# Patient Record
Sex: Female | Born: 1945 | Race: White | Hispanic: No | Marital: Married | Smoking: Never smoker
Health system: Southern US, Community
[De-identification: ages and names within clinical notes are randomized; demographics above are authoritative.]

## PROBLEM LIST (undated history)

## (undated) DIAGNOSIS — T8859XA Other complications of anesthesia, initial encounter: Secondary | ICD-10-CM

## (undated) DIAGNOSIS — Z9889 Other specified postprocedural states: Secondary | ICD-10-CM

## (undated) DIAGNOSIS — C439 Malignant melanoma of skin, unspecified: Secondary | ICD-10-CM

## (undated) DIAGNOSIS — R112 Nausea with vomiting, unspecified: Secondary | ICD-10-CM

## (undated) DIAGNOSIS — M1990 Unspecified osteoarthritis, unspecified site: Secondary | ICD-10-CM

## (undated) DIAGNOSIS — D229 Melanocytic nevi, unspecified: Secondary | ICD-10-CM

## (undated) DIAGNOSIS — T4145XA Adverse effect of unspecified anesthetic, initial encounter: Secondary | ICD-10-CM

## (undated) HISTORY — DX: Unspecified osteoarthritis, unspecified site: M19.90

---
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
## 1995-04-17 HISTORY — PX: SHOULDER ADHESION RELEASE: SHX773

## 1998-09-06 ENCOUNTER — Other Ambulatory Visit: Admission: RE | Admit: 1998-09-06 | Discharge: 1998-09-06 | Payer: Self-pay | Admitting: Gynecology

## 1999-02-21 ENCOUNTER — Ambulatory Visit (HOSPITAL_COMMUNITY): Admission: RE | Admit: 1999-02-21 | Discharge: 1999-02-21 | Payer: Self-pay | Admitting: Gastroenterology

## 1999-04-23 ENCOUNTER — Emergency Department (HOSPITAL_COMMUNITY): Admission: EM | Admit: 1999-04-23 | Discharge: 1999-04-23 | Payer: Self-pay | Admitting: Emergency Medicine

## 1999-05-29 ENCOUNTER — Encounter (INDEPENDENT_AMBULATORY_CARE_PROVIDER_SITE_OTHER): Payer: Self-pay | Admitting: Specialist

## 1999-05-29 ENCOUNTER — Other Ambulatory Visit: Admission: RE | Admit: 1999-05-29 | Discharge: 1999-05-29 | Payer: Self-pay | Admitting: Gynecology

## 1999-09-06 ENCOUNTER — Other Ambulatory Visit: Admission: RE | Admit: 1999-09-06 | Discharge: 1999-09-06 | Payer: Self-pay | Admitting: Gynecology

## 2000-09-10 ENCOUNTER — Other Ambulatory Visit: Admission: RE | Admit: 2000-09-10 | Discharge: 2000-09-10 | Payer: Self-pay | Admitting: Gynecology

## 2001-09-04 ENCOUNTER — Other Ambulatory Visit: Admission: RE | Admit: 2001-09-04 | Discharge: 2001-09-04 | Payer: Self-pay | Admitting: Gynecology

## 2002-09-29 ENCOUNTER — Other Ambulatory Visit: Admission: RE | Admit: 2002-09-29 | Discharge: 2002-09-29 | Payer: Self-pay | Admitting: Gynecology

## 2003-04-17 HISTORY — PX: REPLACEMENT TOTAL HIP W/  RESURFACING IMPLANTS: SUR1222

## 2003-11-04 ENCOUNTER — Other Ambulatory Visit: Admission: RE | Admit: 2003-11-04 | Discharge: 2003-11-04 | Payer: Self-pay | Admitting: Gynecology

## 2004-03-20 ENCOUNTER — Inpatient Hospital Stay (HOSPITAL_COMMUNITY): Admission: RE | Admit: 2004-03-20 | Discharge: 2004-03-23 | Payer: Self-pay | Admitting: Orthopedic Surgery

## 2004-11-15 ENCOUNTER — Other Ambulatory Visit: Admission: RE | Admit: 2004-11-15 | Discharge: 2004-11-15 | Payer: Self-pay | Admitting: Gynecology

## 2005-02-27 DIAGNOSIS — D229 Melanocytic nevi, unspecified: Secondary | ICD-10-CM

## 2005-02-27 HISTORY — DX: Melanocytic nevi, unspecified: D22.9

## 2005-12-19 ENCOUNTER — Other Ambulatory Visit: Admission: RE | Admit: 2005-12-19 | Discharge: 2005-12-19 | Payer: Self-pay | Admitting: Gynecology

## 2009-02-18 ENCOUNTER — Ambulatory Visit: Payer: Self-pay | Admitting: Genetic Counselor

## 2009-03-23 ENCOUNTER — Ambulatory Visit: Payer: Self-pay | Admitting: Genetic Counselor

## 2010-09-01 NOTE — H&P (Signed)
NAMEJULIE-ANNE, [name] NO.:  [id number]   MEDICAL RECORD NO.:  [id number]          PATIENT TYPE:  INP   LOCATION:  NA                           FACILITY:  [facility]   PHYSICIAN:  Ollen Gross, M.D.    DATE OF BIRTH:  [birth year]   DATE OF ADMISSION:  03/20/2004  DATE OF DISCHARGE:                                HISTORY & PHYSICAL   CHIEF COMPLAINT:  Right hip pain.   HISTORY OF PRESENT ILLNESS:  This 65 year old female who has been seen and  followed by Dr. Ollen Gross for ongoing right hip pain.  She has had right  hip pain for some time now.  It has been quite limiting to what she can and  cannot do.  She is seen in the office and has had follow-up x-rays which  show end-stage arthritis with bone-on-bone.  It was felt that she would  benefit from undergoing total hip replacement.   The risks and benefits discussed.  The patient is subsequently admitted to  the hospital.   ALLERGIES:  PROMETRIUM, IODINE, and GLUTTON FISH allergy.   CURRENT MEDICATIONS:  Multivitamins.   PAST MEDICAL HISTORY:  1.  Postmenopausal.  2.  Arthritis.   PAST SURGICAL HISTORY:  1.  Right shoulder surgery in 1997.  2.  Repair of a rectal fissure in 1992.  3.  Right breast biopsy, fibroadenoma in 1991.   SOCIAL HISTORY:  Married.  Registered nurse.  Nonsmoker.  No alcohol.  Has  one son.  Husband, son, and friends will be assisting with the care after  surgery.   FAMILY HISTORY:  Brother deceased with a history of glioblastoma, cancer,  and a sister with a history of breast cancer.   REVIEW OF SYMPTOMS:  GENERAL:  No fevers, chills, night sweats.  NEUROLOGICAL:  No seizure, syncope, or paralysis.  RESPIRATORY:  No  shortness of breath, productive cough, or hemoptysis.  CARDIOVASCULAR:  No  chest pain, angina, or orthopnea.  GI:  No nausea, vomiting, diarrhea,  constipation.  GU:  No dysuria or hematuria.  MUSCULOSKELETAL:  Pertinent  that of the right hip.   PHYSICAL  EXAMINATION:  VITAL SIGNS:  Pulse 60, respirations 12, blood  pressure 112/70.  GENERAL:  A 65 year old, thin-framed, athletic appearing female. Well-  developed, well-nourished in no acute distress.  Alert, oriented, and  cooperative.  HEENT:  Normocephalic and atraumatic.  Pupils are equal, round, and  reactive.  Oropharynx clear.  EOMs are intact.  NECK:  Supple.  No bruits.  CHEST:  Clear anteriorly and posteriorly.  No rhonchi, rales, or wheezing.  HEART:  Regular rate and rhythm.  No murmur.  S1 and S2 noted.  ABDOMEN:  Soft and nontender.  Bowel sounds present.  RECTAL:  Not done, not pertinent to present illness.  BREASTS:  Not done, not pertinent to present illness.  GENITALIA:  Not done, not pertinent to present illness.  EXTREMITIES:  Right hip only shows flexion of 90 degrees.  Internal rotation  about 50 degrees with external rotation of 20 degrees abduction.  Normal  left hip range  of motion.  Minimal antalgic gait.   IMPRESSION:  1.  Osteoarthritis right hip.  2.  Postmenopausal.   PLAN:  The patient is admitted to  [facility].  Undergo right total  hip arthroplasty.  Surgery will be performed by Dr. Ollen Gross.     Alex   ALP/MEDQ  D:  03/19/2004  T:  03/20/2004  Job:  [id number]   cc:   Otilio Connors. Gerri Spore, M.D.  [address]  [place]  [place] [address]  Fax: [phone]   Ollen Gross, M.D.  [facility]  [address]  [address]  [place]  [place] [address]  Fax: [phone]

## 2010-09-01 NOTE — Discharge Summary (Signed)
NAMEDIXIE, COPPA NO.:  [id number]   MEDICAL RECORD NO.:  [id number]          PATIENT TYPE:  INP   LOCATION:  0469                         FACILITY:  [facility]   PHYSICIAN:  Ollen Gross, M.D.    DATE OF BIRTH:  1946/02/02   DATE OF ADMISSION:  03/20/2004  DATE OF DISCHARGE:  03/23/2004                                 DISCHARGE SUMMARY   ADMISSION DIAGNOSES:  1.  Osteoarthritis left knee.  2.  Hypertension.  3.  Reflux disease.  4.  Peripheral edema.  5.  Past history of anemia.   DISCHARGE DIAGNOSES:  1.  Osteoarthritis right hip status post right total knee arthroplasty metal      on metal construct.  2.  Hypokalemia post op improved.  3.  Osteoarthritis left knee.  4.  Hypertension.  5.  Reflux disease.  6.  Peripheral edema.  7.  Past history of anemia.   PROCEDURE:  March 20, 2004 right total hip arthroplasty metal on metal  construct. Surgeon, Ollen Gross, M.D., the assistant Avel Peace, P.A.-C.  General anesthesia, blood loss 400 mL, Hemovac drain x1.   CONSULTATIONS:  None.   BRIEF HISTORY:  Tom[place] is a 65 year old female with end-stage arthritis of  the right hip with intractable pain that has failed nonoperative management  and now presents for a total hip arthroplasty.   LABORATORY DATA:  Preop CBC showed a hemoglobin of 13.6, hematocrit 40.1,  differential normal, postoperative hemoglobin 11.5, last noted H&H 10.8 and  31.5.  PT and PTT preop 12.2 and 28 respectively with an INR of 0.9, serial  pro time last noted PT/INR 18.1 and 1.8.  Chem panel on admission slightly  elevated with a CO2 of 33, remaining chem panel within normal limits.  Serial BMET's were followed.  CO2 came down to 30.  Potassium dropped from  4.5 to 3.3.  Given supplements potassium back up to 3.9.  Remaining  electrolytes remained within normal limits.  Urinalysis preop negative,  blood group type A positive.   Preop EKG March 14, 2004, normal sinus  rhythm, normal EKG, no previous  tracing confirmed by Armanda Magic, M.D.  Chest x-ray two view on March 14, 2004 no active disease, right hip film, severe degenerative disease  right hip joint on March 14, 2004.  Pelvis film on March 20, 2004 right  total hip in good position, right hip film right total hip in good position  March 20, 2004.   HOSPITAL COURSE:  The patient admitted to [facility], taken to the  OR, underwent the above procedure without complications.  Did have some  nausea on the night of surgery and the next morning.  Some pain though most  of it was incisional. Started therapy on day one, Hemovac drain was pulled  on day one, by day two she was doing better.  Had been already up sitting in  the chair the day before Foley came out. She started getting up walking with  more therapy. Dressing change incision healing well.  Noted to have a low  potassium so  she was placed on K-Dur supplements and rechecked.  Potassium  came up the next day to 3.9.  She actually did fairly well with her therapy  on day two ambulating approximately 200 feet and then 150 feet that  afternoon. She did so well by day three she had progressed with her therapy  and was ready to go home.   PLAN:  1.  The patient discharged home on March 23, 2004.  2.  Discharge diagnoses please see above.  3.  Discharge medications:  Percocet, Robaxin and Coumadin.   DIET:  As tolerated.   FOLLOW UP:  Monday or Tuesday on December 19, December 20, call for an  appointment time.   ACTIVITY:  25-50% partial weight bearing right lower extremity, may start  showering, home health PT and home health nursing, hip precautions.   DISPOSITION:  Home.   CONDITION ON DISCHARGE:  Improved.      ALP/MEDQ  D:  05/10/2004  T:  05/10/2004  Job:  [id number]   cc:   Gaspar Garbe, M.D.  [address]  [place]  [place] [address]  Fax: [phone]

## 2010-09-01 NOTE — Op Note (Signed)
Regina [name], RICE NO.:  [id number]   MEDICAL RECORD NO.:  [id number]          PATIENT TYPE:  INP   LOCATION:  0469                         FACILITY:  [facility]   PHYSICIAN:  Ollen Gross, M.D.    DATE OF BIRTH:  March 01, 1946   DATE OF PROCEDURE:  03/20/2004  DATE OF DISCHARGE:                                 OPERATIVE REPORT   PREOPERATIVE DIAGNOSIS:  Osteoarthritis, right hip.   POSTOPERATIVE DIAGNOSIS:  Osteoarthritis, right hip.   PROCEDURE:  Right total hip arthroplasty, metal-on-metal.   SURGEON:  Gus Rankin. Aluisio, M.D.   ASSISTANT:  Avel Peace, PA-C.   ANESTHESIA:  General.   ESTIMATED BLOOD LOSS:  400.   DRAIN:  Hemovac x 1.   COMPLICATIONS:  None.   CONDITION:  Stable to recovery.   BRIEF CLINICAL NOTE:  Regina [name] is a 65 year old female, who has end-stage  arthritis of the right hip with intractable pain.  She has failed  nonoperative management and presents now for right hip arthroplasty.   PROCEDURE IN DETAIL:  After the successful administration of general  anesthetic, the patient is placed in the left lateral decubitus position  with the right side up and held with the hip positioner.  The right lower  extremity is isolated from her perineum with plastic drapes and prepped and  draped in the usual sterile fashion.  Mini posterolateral incision is made  with a 10 blade through subcutaneous tissue to the level of the fascia lata  which is incised in line with the skin incision.  Sciatic nerve is palpated  and protected and short rotators isolated off the femur.  Capsulectomy is  performed and the hip is dislocated.  The center of the femoral head is  marked, and the trial prosthesis is placed such that the center of the trial  head corresponds to the center of her native femoral head.  An osteotomy  line is marked on the femoral neck and osteotomy made with an oscillating  saw.  The femoral head is removed and the femur then retracted  anteriorly to  gain acetabular exposure.   She had a hypertrophic labrum and a shallow acetabulum.  The labrum and  osteophytes are removed.  There is a lot of hypertrophic tissue in the fovea  which is also removed.  Reaming starts at 45, coursing in increments of 2 to  a 49 mm, and then a 50 mm Pinnacle acetabular shell is placed in anatomic  position and transfixed with two domed screws.  Trial 28 mm neutral liner is  placed.   Femur is then prepared, first with the canal finder and then irrigation.  Axial remaining is performed up to 11.5 mm, proximal reaming to a 16 D, and  the sleeve machined to a large.  A 16D large trial sleeve is placed with a  16 x 11 stem, 36 plus zero neck.  We placed a 28 plus 0 head and reduced the  head.  She had great stability, but there was a little bit of soft tissue  laxity, thus we went to a 36  plus 6 neck with a 28 plus zero head and as the  abductor tension was much more appropriate.  We achieved full extension,  full external rotation, 70 degrees flexion, 40 degrees adduction, and 90  degrees internal rotation, and 90 degrees flexion, 90 degrees internal  rotation.  I was able to flex up to 140 degrees, and the hip still remained  located.  By placing the right leg on top of the left, the leg lengths were  equal.  Trials are then removed, and the permanent apex hole eliminator and  permanent 28 mm neutral Ultra-met metal liner is placed.  This is a metal-on-  metal hip replacement.  Permanent 16D large sleeve is placed with a 16 x 11  stem, 36 plus 6 neck, matching her native anteversion.  The 28 plus 0 head  is placed, and the hip is reduced with the same stability parameters.  The  wound is copiously irrigated with saline solution and short rotators  reattached to the femur through drill holes.  Fascia lata is closed over a  Hemovac drain with interrupted #1 Vicryl, subcu closed with #1 and then 2-0  Vicryl, and subcuticular with running 4-0  Monocryl.  Incision is cleaned and  dried, and Steri-Strips and a bulky sterile dressing applied.  Drain is  hooked to suction, and she is placed into a knee immobilizer, awakened, and  transported to recovery in stable condition.     Drenda Freeze   FA/MEDQ  D:  03/20/2004  T:  03/20/2004  Job:  [id number]

## 2011-06-12 ENCOUNTER — Other Ambulatory Visit: Payer: Self-pay | Admitting: Otolaryngology

## 2011-06-12 DIAGNOSIS — H748X9 Other specified disorders of middle ear and mastoid, unspecified ear: Secondary | ICD-10-CM

## 2011-06-20 ENCOUNTER — Other Ambulatory Visit: Payer: Self-pay

## 2011-11-16 ENCOUNTER — Ambulatory Visit (HOSPITAL_COMMUNITY): Admission: RE | Admit: 2011-11-16 | Payer: Self-pay | Source: Ambulatory Visit

## 2011-11-28 ENCOUNTER — Ambulatory Visit (INDEPENDENT_AMBULATORY_CARE_PROVIDER_SITE_OTHER): Payer: BC Managed Care – PPO | Admitting: Sports Medicine

## 2011-11-28 ENCOUNTER — Ambulatory Visit: Payer: Self-pay | Admitting: Sports Medicine

## 2011-11-28 ENCOUNTER — Encounter: Payer: Self-pay | Admitting: Sports Medicine

## 2011-11-28 VITALS — BP 119/76 | HR 76 | Ht 61.5 in | Wt 125.0 lb

## 2011-11-28 DIAGNOSIS — M23042 Cystic meniscus, anterior horn of lateral meniscus, left knee: Secondary | ICD-10-CM | POA: Insufficient documentation

## 2011-11-28 DIAGNOSIS — M23302 Other meniscus derangements, unspecified lateral meniscus, unspecified knee: Secondary | ICD-10-CM

## 2011-11-28 MED ORDER — TRAMADOL HCL 50 MG PO TABS: 50.0000 mg | ORAL_TABLET | Freq: Two times a day (BID) | ORAL | Status: AC | PRN

## 2011-11-28 NOTE — Patient Instructions (Addendum)
Very nice to meet you For your knee keep wearing the Body Helix when you play.  Wear the brace even 1 hour after playing.  We will be giving you some exercises for you to try  to help with your pain.  Avoid deep squatting.  Biking would be great.   I am going to send in Rx for tramadol to try.  Take 1 pill twice daily as needed. It may make you sleepy at first.  Would like to see you again in 4-6 Weeks.  If still having pain then will consider a nitroglycerin patch as well.

## 2011-11-28 NOTE — Assessment & Plan Note (Addendum)
We do believe that this cyst and likely the degenerative tear that she has in her lateral meniscus of the left knee is contributing to her problem. Patient is already wearing a body helix and taking anti-inflammatories. At this point we do not see any signs of inflammation so we will change her medication to tramadol to see if we can help with pain relief. Encourage patient to continue to wear the body helix. Patient was also given sports insotes today to wear to see if we can help with the cushioning that could help with the force that is being contributed to this pain. Patient will try these interventions and continue the other modalities such as stretching and icing that she's done previously as well. Patient can come back in 4-6 weeks for reevaluation and rescan to see if her making any improvement. At that time if her still having trouble will consider doing a nitroglycerin patch to see if we can increase neovascularization and healing

## 2011-11-28 NOTE — Progress Notes (Signed)
Ms. Regina [name] is a very pleasant 66 year old female coming here to establish care for a new problem. Patient is coming in with a complaint of left lateral knee pain. Patient states that she may have injured it back in may of 2013 the playing tennis. Patient states that she twisted her knee and had some pain on the lateral aspect. Patient saw Dr. Trudee Grip for the problem initially who told her that she had a strain LCL and treated her conservatively. Patient states that she improved over the course of time but still had never got back to 100%. Patient states that she can do all her activities of daily living but then has problems with tenderness with any the cutting or rotational components. Patient denies any swelling, any radiation of pain down her leg, any numbness or weakness in the lower extremity. Patient has a past medical history significant for a partial tear of her medial meniscus on the right side that resolved after PT in 2010. Otherwise unremarkable.  She is using a body helix compression sleeve and gets good relief from that.  14 point system review done in always negative unless stated in history of present illness.  Past Medical History  Diagnosis Date  . Arthritis    Past Surgical History  Procedure Date  . Replacement total hip w/  resurfacing implants 2005   History reviewed. No pertinent family history. History   Social History  . Marital Status: Married    Spouse Name: N/A    Number of Children: N/A  . Years of Education: N/A   Occupational History  . Not on file.   Social History Main Topics  . Smoking status: Never Smoker   . Smokeless tobacco: Never Used  . Alcohol Use: 1.8 oz/week    3 Glasses of wine per week  . Drug Use: No  . Sexually Active: Not on file   Other Topics Concern  . 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    Physical  exam Filed Vitals:   11/28/11 1101  BP: 119/76  Pulse: 76   General: No apparent distress patient is alert and oriented x3. Mood is normal and affect is normal. Respiratory: The patient speaks in complete sentences and does not appear short of breath Sensation: Neurovascularly intact and no edema in any of the extremities. Patient has 2+ distal pulses. Neurologic: Deep tendon reflexes are 2+ and intact and symmetric. Left knee exam: On inspection there is no abnormality seen. On palpation patient does have tenderness over the LCL and lateral knee area. No edema noted no warmness spell. Patient though all ligamentous seem to be intact. Patient has a negative McMurray as well as a negative Thessaly test. Neurovascularly intact distally.  Ultrasound was performed and interpreted by me today. Looking at patient's lateral meniscus she does have a significant degenerative tear mostly in the anterior medial aspect that does not have significant amount of hypoechoic changes surrounding it. Patient's LCL though is protruding from  joint space some too. What appears to be a peri-meniscal cyst is also seen in the lateral meniscus. Otherwise patellar tendon, quadricep tendon, and medial meniscus all appear to be normal. Patient does have some mild bone spurring both on the medial and lateral joint compartments.

## 2012-01-09 ENCOUNTER — Ambulatory Visit (INDEPENDENT_AMBULATORY_CARE_PROVIDER_SITE_OTHER): Payer: BC Managed Care – PPO | Admitting: Sports Medicine

## 2012-01-09 ENCOUNTER — Encounter: Payer: Self-pay | Admitting: Sports Medicine

## 2012-01-09 VITALS — BP 133/79 | HR 59 | Ht 61.25 in | Wt 125.0 lb

## 2012-01-09 DIAGNOSIS — M23302 Other meniscus derangements, unspecified lateral meniscus, unspecified knee: Secondary | ICD-10-CM

## 2012-01-09 DIAGNOSIS — M23042 Cystic meniscus, anterior horn of lateral meniscus, left knee: Secondary | ICD-10-CM

## 2012-01-09 NOTE — Patient Instructions (Addendum)
Ok to take 2 aleve before playing tennis  Continue using bodyhelix during and 1 hour after playing tennis  Continue knee exercises on days you are not playing tennis  Contact our office if your knee starts locking - Dr. Darrick Penna will consider injection  Please follow up as needed  Thank you for seeing [place] today!

## 2012-01-09 NOTE — Progress Notes (Signed)
  Subjective:    Patient ID: Regina [name], female    DOB: Jun 02, 1945, 66 y.o.   MRN: [id number]  HPI  Pt presents to clinic for f/u of lt lateral knee pain which is slightly improved. Tramadol not very helpful. Went to urgent care for foot injury- was given meloxicam which was very helpful for knee pain.  She stopped after 3 doses 2/2 easy bruising. Bodyhelix for activity- very helpful. Home knee exercises 3-4 times per week. Playing tennis a few times per week. No knee swelling, locking, or giving way.   Interested in trying voltaren to take before playing tennis   She also gets relief with Aleve    Review of Systems     Objective:   Physical Exam  Lt knee exam: Lateral puffiness No effusion Full extension Full flexion Click on Mcmurray's laterally, but no pain Thessaly negative  Slight crepitation over patella bilat       Assessment & Plan:

## 2012-01-09 NOTE — Assessment & Plan Note (Signed)
Patient is stable but still shows changes of chronic degenerative meniscus injury   Continue on compression  Continue on home exercise  She should use either tramadol or Aleve before playing just for pain control  She can see [place] for injection if it swells too much or if she gets any mechanical symptoms

## 2012-03-18 DIAGNOSIS — R0789 Other chest pain: Secondary | ICD-10-CM | POA: Diagnosis not present

## 2012-03-31 ENCOUNTER — Ambulatory Visit
Admission: RE | Admit: 2012-03-31 | Discharge: 2012-03-31 | Disposition: A | Discharge status: Home / Self Care | Payer: Medicare Other | Source: Ambulatory Visit | Attending: Family Medicine | Admitting: Family Medicine

## 2012-03-31 ENCOUNTER — Other Ambulatory Visit: Payer: Self-pay | Admitting: Family Medicine

## 2012-03-31 DIAGNOSIS — R0789 Other chest pain: Secondary | ICD-10-CM | POA: Diagnosis not present

## 2012-04-03 DIAGNOSIS — H5210 Myopia, unspecified eye: Secondary | ICD-10-CM | POA: Diagnosis not present

## 2012-04-03 DIAGNOSIS — H524 Presbyopia: Secondary | ICD-10-CM | POA: Diagnosis not present

## 2012-04-03 DIAGNOSIS — H2510 Age-related nuclear cataract, unspecified eye: Secondary | ICD-10-CM | POA: Diagnosis not present

## 2012-05-20 DIAGNOSIS — Z23 Encounter for immunization: Secondary | ICD-10-CM | POA: Diagnosis not present

## 2012-05-20 DIAGNOSIS — Z136 Encounter for screening for cardiovascular disorders: Secondary | ICD-10-CM | POA: Diagnosis not present

## 2012-05-20 DIAGNOSIS — Z0000 Encounter for general adult medical examination without abnormal findings: Secondary | ICD-10-CM | POA: Diagnosis not present

## 2012-05-20 DIAGNOSIS — Z131 Encounter for screening for diabetes mellitus: Secondary | ICD-10-CM | POA: Diagnosis not present

## 2012-07-29 ENCOUNTER — Other Ambulatory Visit: Payer: Self-pay | Admitting: *Deleted

## 2012-07-29 MED ORDER — DICLOFENAC SODIUM 1 % TD GEL: 4.0000 g | Freq: Four times a day (QID) | TRANSDERMAL | Status: DC

## 2012-09-25 DIAGNOSIS — Z1231 Encounter for screening mammogram for malignant neoplasm of breast: Secondary | ICD-10-CM | POA: Diagnosis not present

## 2012-09-30 DIAGNOSIS — Z1212 Encounter for screening for malignant neoplasm of rectum: Secondary | ICD-10-CM | POA: Diagnosis not present

## 2012-09-30 DIAGNOSIS — Z130 Encounter for screening for diseases of the blood and blood-forming organs and certain disorders involving the immune mechanism: Secondary | ICD-10-CM | POA: Diagnosis not present

## 2012-09-30 DIAGNOSIS — Z01419 Encounter for gynecological examination (general) (routine) without abnormal findings: Secondary | ICD-10-CM | POA: Diagnosis not present

## 2013-01-12 DIAGNOSIS — Z9189 Other specified personal risk factors, not elsewhere classified: Secondary | ICD-10-CM | POA: Diagnosis not present

## 2013-01-12 DIAGNOSIS — D239 Other benign neoplasm of skin, unspecified: Secondary | ICD-10-CM | POA: Diagnosis not present

## 2013-01-12 DIAGNOSIS — L821 Other seborrheic keratosis: Secondary | ICD-10-CM | POA: Diagnosis not present

## 2013-01-12 DIAGNOSIS — I781 Nevus, non-neoplastic: Secondary | ICD-10-CM | POA: Diagnosis not present

## 2013-04-06 DIAGNOSIS — H2510 Age-related nuclear cataract, unspecified eye: Secondary | ICD-10-CM | POA: Diagnosis not present

## 2013-05-22 DIAGNOSIS — Z0000 Encounter for general adult medical examination without abnormal findings: Secondary | ICD-10-CM | POA: Diagnosis not present

## 2013-05-22 DIAGNOSIS — Z23 Encounter for immunization: Secondary | ICD-10-CM | POA: Diagnosis not present

## 2013-05-22 DIAGNOSIS — E785 Hyperlipidemia, unspecified: Secondary | ICD-10-CM | POA: Diagnosis not present

## 2013-06-17 DIAGNOSIS — Z780 Asymptomatic menopausal state: Secondary | ICD-10-CM | POA: Diagnosis not present

## 2013-07-07 DIAGNOSIS — M25569 Pain in unspecified knee: Secondary | ICD-10-CM | POA: Diagnosis not present

## 2013-07-07 DIAGNOSIS — M25559 Pain in unspecified hip: Secondary | ICD-10-CM | POA: Diagnosis not present

## 2013-07-07 DIAGNOSIS — Z96649 Presence of unspecified artificial hip joint: Secondary | ICD-10-CM | POA: Diagnosis not present

## 2013-07-13 DIAGNOSIS — M25569 Pain in unspecified knee: Secondary | ICD-10-CM | POA: Diagnosis not present

## 2013-07-13 DIAGNOSIS — Z96649 Presence of unspecified artificial hip joint: Secondary | ICD-10-CM | POA: Diagnosis not present

## 2013-07-24 DIAGNOSIS — Z96649 Presence of unspecified artificial hip joint: Secondary | ICD-10-CM | POA: Diagnosis not present

## 2013-09-25 DIAGNOSIS — Z1231 Encounter for screening mammogram for malignant neoplasm of breast: Secondary | ICD-10-CM | POA: Diagnosis not present

## 2013-09-25 DIAGNOSIS — Z803 Family history of malignant neoplasm of breast: Secondary | ICD-10-CM | POA: Diagnosis not present

## 2013-10-12 DIAGNOSIS — Z01419 Encounter for gynecological examination (general) (routine) without abnormal findings: Secondary | ICD-10-CM | POA: Diagnosis not present

## 2013-10-12 DIAGNOSIS — R319 Hematuria, unspecified: Secondary | ICD-10-CM | POA: Diagnosis not present

## 2013-11-03 DIAGNOSIS — Z1211 Encounter for screening for malignant neoplasm of colon: Secondary | ICD-10-CM | POA: Diagnosis not present

## 2013-11-03 DIAGNOSIS — K648 Other hemorrhoids: Secondary | ICD-10-CM | POA: Diagnosis not present

## 2013-11-03 DIAGNOSIS — Z800 Family history of malignant neoplasm of digestive organs: Secondary | ICD-10-CM | POA: Diagnosis not present

## 2014-03-09 DIAGNOSIS — L821 Other seborrheic keratosis: Secondary | ICD-10-CM | POA: Diagnosis not present

## 2014-03-09 DIAGNOSIS — D485 Neoplasm of uncertain behavior of skin: Secondary | ICD-10-CM | POA: Diagnosis not present

## 2014-03-10 DIAGNOSIS — B079 Viral wart, unspecified: Secondary | ICD-10-CM | POA: Diagnosis not present

## 2014-03-10 DIAGNOSIS — K641 Second degree hemorrhoids: Secondary | ICD-10-CM | POA: Diagnosis not present

## 2014-03-24 DIAGNOSIS — K641 Second degree hemorrhoids: Secondary | ICD-10-CM | POA: Diagnosis not present

## 2014-04-02 DIAGNOSIS — L700 Acne vulgaris: Secondary | ICD-10-CM | POA: Diagnosis not present

## 2014-04-07 DIAGNOSIS — H01004 Unspecified blepharitis left upper eyelid: Secondary | ICD-10-CM | POA: Diagnosis not present

## 2014-04-07 DIAGNOSIS — H01001 Unspecified blepharitis right upper eyelid: Secondary | ICD-10-CM | POA: Diagnosis not present

## 2014-04-07 DIAGNOSIS — H5213 Myopia, bilateral: Secondary | ICD-10-CM | POA: Diagnosis not present

## 2014-04-21 DIAGNOSIS — K641 Second degree hemorrhoids: Secondary | ICD-10-CM | POA: Diagnosis not present

## 2014-05-24 DIAGNOSIS — Z0000 Encounter for general adult medical examination without abnormal findings: Secondary | ICD-10-CM | POA: Diagnosis not present

## 2014-05-24 DIAGNOSIS — Z113 Encounter for screening for infections with a predominantly sexual mode of transmission: Secondary | ICD-10-CM | POA: Diagnosis not present

## 2014-05-24 DIAGNOSIS — E785 Hyperlipidemia, unspecified: Secondary | ICD-10-CM | POA: Diagnosis not present

## 2014-05-24 DIAGNOSIS — M1990 Unspecified osteoarthritis, unspecified site: Secondary | ICD-10-CM | POA: Diagnosis not present

## 2014-06-16 DIAGNOSIS — J029 Acute pharyngitis, unspecified: Secondary | ICD-10-CM | POA: Diagnosis not present

## 2014-08-26 ENCOUNTER — Ambulatory Visit (INDEPENDENT_AMBULATORY_CARE_PROVIDER_SITE_OTHER): Payer: Medicare Other | Admitting: Sports Medicine

## 2014-08-26 ENCOUNTER — Encounter: Payer: Self-pay | Admitting: Sports Medicine

## 2014-08-26 VITALS — BP 132/53 | Ht 61.0 in | Wt 125.0 lb

## 2014-08-26 DIAGNOSIS — M23042 Cystic meniscus, anterior horn of lateral meniscus, left knee: Secondary | ICD-10-CM

## 2014-08-26 MED ORDER — CELECOXIB 200 MG PO CAPS: 200.0000 mg | ORAL_CAPSULE | Freq: Every day | ORAL | Status: DC

## 2014-08-26 NOTE — Patient Instructions (Signed)
Keep using the body helix. Try using ICE as often as needed Keep doing the knee exercises.  Try the new side lying exercises  Body Helix

## 2014-08-26 NOTE — Assessment & Plan Note (Signed)
Degenerative meniscal tear & potential pseudocyst secondary to significant lateral compartment degenerative changes. Discussed options extensively today. Continue body helix with activity. New sleeve provided today. HEP: Hip abduction exercises, straight leg raises. Patient will start back using the additional cushioned orthotics she previously had she does have some forefoot breakdown that may be putting increased stress across her knee. Rx for Celebrex. If this is not able to be covered would consider trying Relafen she has not tried this in the past. Avoid meloxicam as this caused a would sounds like a Raynaud's phenomenon.

## 2014-08-26 NOTE — Progress Notes (Signed)
  Regina [name] - 69 y.o. female MRN [id number]  Date of birth: 06-Dec-1945  SUBJECTIVE: CC: 1.  left knee pain, follow-up      HPI:   long-standing history of left lateral intermittent knee pain.  Recently flared up after furniture market and intensive tenderness.  Denies any clicking, popping, locking or other mechanical symptoms. Pain focally over the lateral joint line.  Denies any numbness, tingling or pain radiating down past her knee.  Occasionally notices small amount of swelling with difficulty bending but none currently.  Has been using a body helix, ibuprofen with only some improvement.      ROS:  per HPI    HISTORY:  Past Medical, Surgical, Social, and Family History reviewed & updated per EMR.  Pertinent Historical Findings include: Social History   Occupational History  . Not on file.   Social History Main Topics  . Smoking status: Never Smoker   . Smokeless tobacco: Never Used  . Alcohol Use: 1.8 oz/week    3 Glasses of wine per week  . Drug Use: No  . Sexual Activity: Not on file   prior intolerances to meloxicam and lack of efficacy with Voltaren and Aleve s/p right total hip arthroplasty  OBJECTIVE:  VS:   HT:5\' 1"  (154.9 cm)   WT:125 lb (56.7 kg)  BMI:23.7          BP:(!) 132/53 mmHg  HR: bpm  TEMP: ( )  RESP:   PHYSICAL EXAM: GENERAL:  adult Caucasian female. No acute distress  PSYCH: Alert and appropriately interactive.  SKIN: No open skin lesions or abnormal skin markings on areas inspected as below  VASCULAR:  bilateral DP pulses 2+/4. No significant pretibial edema   NEURO: Lower extremity strength is 5+/5 in all myotomes; sensation is intact to light touch in all dermatomes.  LEFT KNEE: Overall well aligned no significant effusion. She is stable to varus and valgus strain. She does have lateral jointline tenderness. Positive pain with McMurray's but no mechanical symptoms. Positive Thessaly. Stable to anterior posterior drawer. ROM:  0-130. Quad extensor strength 5+, hip abduction strength 5 minus bilaterally.  DATA OBTAINED: No notes on file   Limited MSK Ultrasound of Left Kee: Findings: Patella & Patellar Tendon: Normal Quad & Quad Tendon: Normal Suprapatellar Pouch: slight supraphysiologic fluid whithout overt effusion Medial Joint Line: Normal Lateral Joint Line: Abnormal- Extruded meniscus with osteophytic spurring of both femoral and tibial components. Small questionable pseudocyst versus free body versus hypoechoic shadowing over tibial component Posterior Knee: n/a  Impression: The above findings are consistent with degenerative lateral meniscal tear      ASSESSMENT & PLAN: See problem based charting & AVS for additional documentation Problem List Items Addressed This Visit    Cyst of anterior horn of lateral meniscus of left knee - Primary    Degenerative meniscal tear & potential pseudocyst secondary to significant lateral compartment degenerative changes. Discussed options extensively today. Continue body helix with activity. New sleeve provided today. HEP: Hip abduction exercises, straight leg raises. Patient will start back using the additional cushioned orthotics she previously had she does have some forefoot breakdown that may be putting increased stress across her knee. Rx for Celebrex. If this is not able to be covered would consider trying Relafen she has not tried this in the past. Avoid meloxicam as this caused a would sounds like a Raynaud's phenomenon.        FOLLOW UP:  Return if symptoms worsen or fail to improve.

## 2014-08-31 ENCOUNTER — Ambulatory Visit: Payer: PRIVATE HEALTH INSURANCE | Admitting: Sports Medicine

## 2014-09-09 ENCOUNTER — Telehealth: Payer: Self-pay | Admitting: *Deleted

## 2014-09-09 NOTE — Telephone Encounter (Signed)
Called [employer] [facility] to get a prior auth for celebrex.  It was approved and good until 04/16/15, [id number] Spoke to patient who will contact her pharmacy and see about the cost.  If it is too expensive she will call us back for an alternative

## 2014-09-24 ENCOUNTER — Telehealth: Payer: Self-pay | Admitting: *Deleted

## 2014-09-24 NOTE — Telephone Encounter (Signed)
Pt left message saying that the celebrex is helping her pain

## 2014-09-27 DIAGNOSIS — Z803 Family history of malignant neoplasm of breast: Secondary | ICD-10-CM | POA: Diagnosis not present

## 2014-09-27 DIAGNOSIS — Z1231 Encounter for screening mammogram for malignant neoplasm of breast: Secondary | ICD-10-CM | POA: Diagnosis not present

## 2014-11-13 DIAGNOSIS — M6281 Muscle weakness (generalized): Secondary | ICD-10-CM | POA: Diagnosis not present

## 2014-11-13 DIAGNOSIS — E860 Dehydration: Secondary | ICD-10-CM | POA: Diagnosis not present

## 2014-11-13 DIAGNOSIS — R531 Weakness: Secondary | ICD-10-CM | POA: Diagnosis not present

## 2014-11-13 DIAGNOSIS — R42 Dizziness and giddiness: Secondary | ICD-10-CM | POA: Diagnosis not present

## 2014-11-13 DIAGNOSIS — R110 Nausea: Secondary | ICD-10-CM | POA: Diagnosis not present

## 2014-11-29 DIAGNOSIS — Z124 Encounter for screening for malignant neoplasm of cervix: Secondary | ICD-10-CM | POA: Diagnosis not present

## 2014-11-29 DIAGNOSIS — Z6823 Body mass index (BMI) 23.0-23.9, adult: Secondary | ICD-10-CM | POA: Diagnosis not present

## 2014-12-12 IMAGING — CR DG CHEST 2V
2 series · 2 of 2 positions shown · non-contrast
Comparison: Chest x-ray of 03/14/2004

CLINICAL DATA: Chest of fullness, lump in throat

CHEST - 2 VIEW

[view not recorded (1 of 2)]
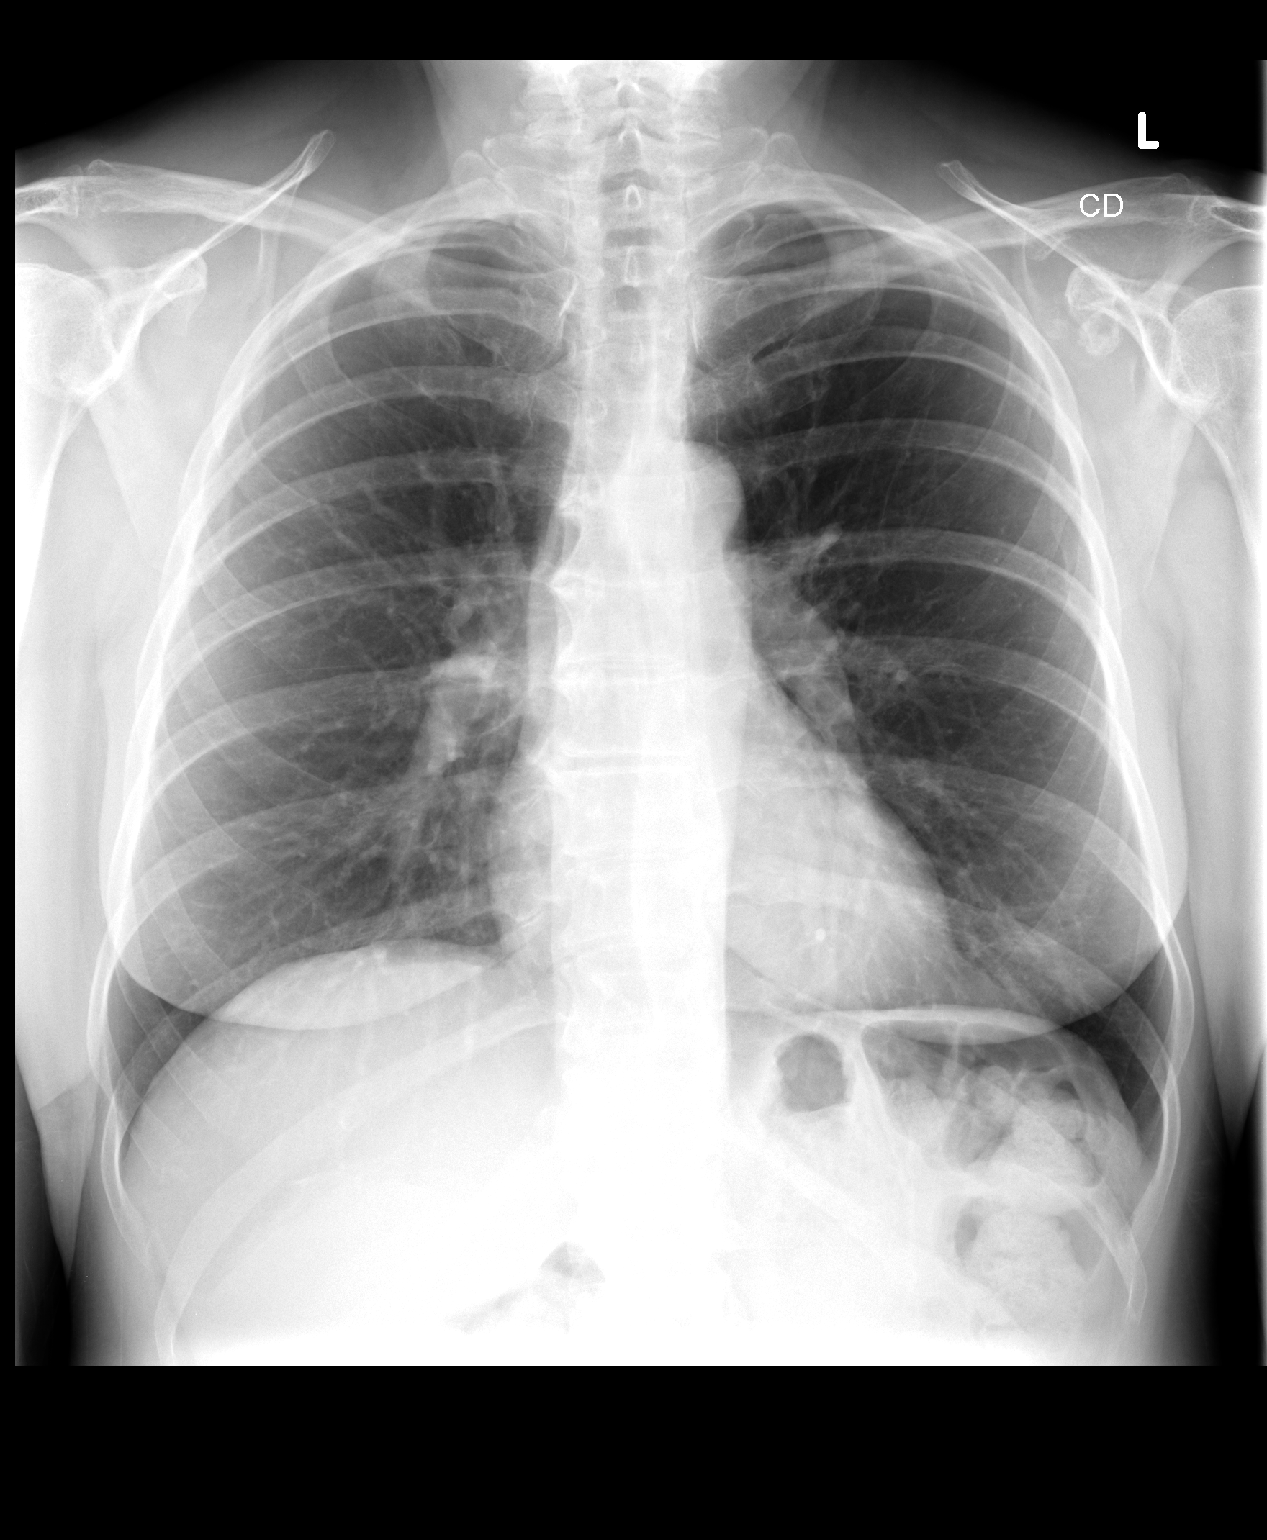

[view not recorded (2 of 2)]
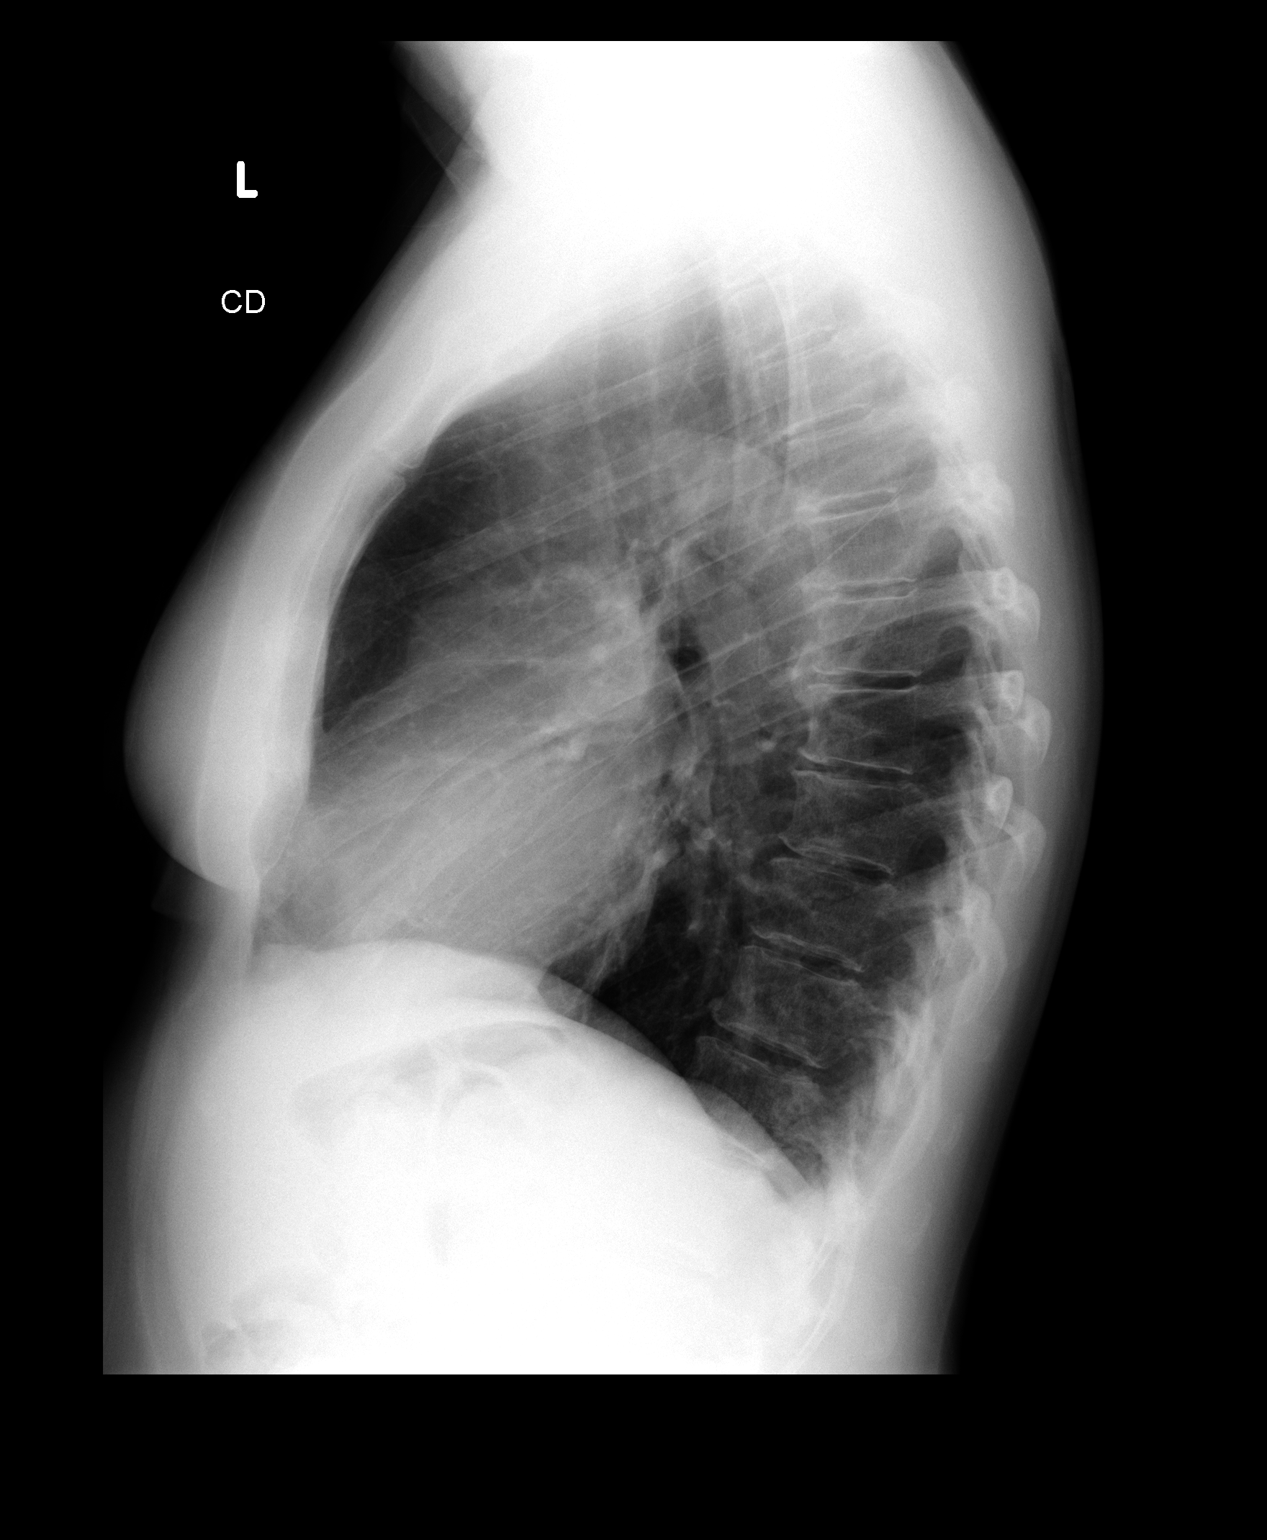

[2 of 2 positions shown; findings below may reference images not displayed]

FINDINGS: The lungs are clear and minimally hyperaerated.
Mediastinal contours appear normal.  The heart is within normal
limits in size.  Mild degenerative changes noted throughout the
thoracic spine.
IMPRESSION: No active lung disease.

## 2015-01-10 DIAGNOSIS — Z86018 Personal history of other benign neoplasm: Secondary | ICD-10-CM | POA: Diagnosis not present

## 2015-01-10 DIAGNOSIS — L821 Other seborrheic keratosis: Secondary | ICD-10-CM | POA: Diagnosis not present

## 2015-01-10 DIAGNOSIS — D225 Melanocytic nevi of trunk: Secondary | ICD-10-CM | POA: Diagnosis not present

## 2015-02-21 DIAGNOSIS — M79674 Pain in right toe(s): Secondary | ICD-10-CM | POA: Diagnosis not present

## 2015-02-28 ENCOUNTER — Other Ambulatory Visit: Payer: Self-pay | Admitting: *Deleted

## 2015-02-28 MED ORDER — CELECOXIB 200 MG PO CAPS: 200.0000 mg | ORAL_CAPSULE | Freq: Every day | ORAL | Status: DC

## 2015-03-15 ENCOUNTER — Encounter: Payer: Self-pay | Admitting: Sports Medicine

## 2015-03-15 ENCOUNTER — Ambulatory Visit (INDEPENDENT_AMBULATORY_CARE_PROVIDER_SITE_OTHER): Payer: Medicare Other | Admitting: Sports Medicine

## 2015-03-15 VITALS — BP 124/72 | Ht 61.0 in | Wt 125.0 lb

## 2015-03-15 DIAGNOSIS — M25561 Pain in right knee: Secondary | ICD-10-CM

## 2015-03-15 NOTE — Progress Notes (Signed)
Patient ID: Regina [name], female   DOB: [birth year], 69 y.o.   MRN: [id number] Ms. Danner is a 69 year old female with known bilateral knee osteoarthritis who presents to clinic complaining of right lateral knee pain. Patient states she was playing tennis yesterday at 11:30 am when she was running for the ball and felt a sharp pain on the lateral side of her right knee. She is unsure if she twisted her knee but thinks this could be possible. She states she tried to continue to play but was forced to stop after 5 minutes due to the severe pain and the stiffness she felt. She describes the stiffness as the need to keep her knee straight to alleviate the pain. She did note immediate swelling but no popping or clicking and no instability. She has iced and used a sleeve compression on her knee with no relief. She also states she took celebrex 200mg  this morning but the pain is still persistent.  PMHx: Left knee meniscus tear Social: School Nurse  Review of Systems: (+) right knee pain, swelling (-) knee redness, warmth, numbness and tingling, multiple joint swelling  Physical Examination BP 124/72 mmHg  Ht 5\' 1"  (1.549 m)  Wt 125 lb (56.7 kg)  BMI 23.63 kg/m2 Constitutional: Well-appearing, well nourished Gait: Limping with right leg hyperextended for comfort Knee Exam: Left knee: Full ROM, no swelling noted, Lachman's, anterior drawer, posterior drawer,   valgus and varus test, McMurray's are negative. No TTP over medial or lateral joint line or   patella.           Right Knee: Full ROM but with slowed movement, small effusion noted, TTP over lateral   joint line and positive McMurray's. No TTP over medial joint line or patella. Negative   Lachman's, anterior drawer, posterior drawer, valgus and varus test.  Ultrasound showed small knee effusion and hypoechogenicity along the lateral meniscus. Findings consistent with a left lateral meniscal tear.  Assessment and Plan: 69 year old otherwise healthy  female with: 1. Left lateral meniscal tear: - Celebrex 200mg  once a day for 7 days - Instructed to do Isometric quadricep exercises and ROM as tolerated - New sleeve compression provided and instructed to use daily - Follow up in 1 week. If no improvement then consider imaging initially in the form of x-rays and possibly in the form of an MRI.

## 2015-03-22 ENCOUNTER — Ambulatory Visit: Payer: Medicare Other | Admitting: Sports Medicine

## 2015-03-24 ENCOUNTER — Ambulatory Visit (INDEPENDENT_AMBULATORY_CARE_PROVIDER_SITE_OTHER): Payer: Medicare Other | Admitting: Sports Medicine

## 2015-03-24 VITALS — BP 120/60

## 2015-03-24 DIAGNOSIS — M25561 Pain in right knee: Secondary | ICD-10-CM

## 2015-03-24 DIAGNOSIS — S83281D Other tear of lateral meniscus, current injury, right knee, subsequent encounter: Secondary | ICD-10-CM

## 2015-03-24 MED ORDER — CELECOXIB 200 MG PO CAPS: 200.0000 mg | ORAL_CAPSULE | Freq: Every day | ORAL | Status: DC

## 2015-03-24 NOTE — Progress Notes (Signed)
   Subjective:    Patient ID: Regina [name], female    DOB: Oct 14, 1945, 69 y.o.   MRN: [id number]  HPI   Patient comes in today for follow-up on right knee pain. Overall her pain has improved. She has regained nearly full range of motion. She has been able to do some pool exercises. She is walking much more comfortably. She has been on Celebrex for the past 10 days and feels like it has been helpful. She has not noticed any swelling. She denies locking or catching in the knee.    Review of Systems     Objective:   Physical Exam Well-developed, well-nourished. No acute distress  Right knee: Patient demonstrates full range of motion. There is no appreciable effusion. She is tender to palpation along the lateral joint line. Pain but no popping with McMurray's. Good joint stability. Neurovascularly intact distally. Walking without significant limp.       Assessment & Plan:  Improving left knee pain with ultrasound evidence of a peripheral lateral meniscal tear  Ultrasound last week showed a peripheral tear of the lateral meniscus. Clinically she is doing much better. She'll continue with her home exercises. She would also like to try a little formal physical therapy. I think she is okay to transition from the pool to an exercise bike if she would like. I still do not want her playing any tennis until follow-up with me in 5 weeks. At that visit I plan on repeating her ultrasound to see if there is any objective evidence of lateral meniscal healing. I want her to wean off of her Celebrex using it only as needed. She will let me know if she has questions or concerns in the interim.

## 2015-04-06 ENCOUNTER — Ambulatory Visit: Payer: Medicare Other | Attending: Family Medicine | Admitting: Physical Therapy

## 2015-04-06 DIAGNOSIS — M25561 Pain in right knee: Secondary | ICD-10-CM | POA: Insufficient documentation

## 2015-04-06 DIAGNOSIS — Z789 Other specified health status: Secondary | ICD-10-CM | POA: Diagnosis not present

## 2015-04-06 DIAGNOSIS — Z658 Other specified problems related to psychosocial circumstances: Secondary | ICD-10-CM | POA: Insufficient documentation

## 2015-04-06 DIAGNOSIS — R29898 Other symptoms and signs involving the musculoskeletal system: Secondary | ICD-10-CM

## 2015-04-06 DIAGNOSIS — R6889 Other general symptoms and signs: Secondary | ICD-10-CM

## 2015-04-06 NOTE — Therapy (Signed)
Regina [name], [place], [address] Phone: [phone]   Fax:  [phone]  Physical Therapy Evaluation  Patient Details  Name: Regina [name] MRN: [id number] Date of Birth: 08/03/1945 Referring Provider: Lilia Argue, DO  Encounter Date: 04/06/2015      PT End of Session - 04/06/15 1537    Visit Number 1   Number of Visits 8   Date for PT Re-Evaluation 06/01/15   Authorization Type medicare g-coded by 9th visit, KX modifier at 15th visit.    PT Start Time 1413   PT Stop Time 1509   PT Time Calculation (min) 56 min   Activity Tolerance Patient tolerated treatment well;No increased pain   Behavior During Therapy [facility] for tasks assessed/performed      Past Medical History  Diagnosis Date  . Arthritis     Past Surgical History  Procedure Laterality Date  . Replacement total hip w/  resurfacing implants  2005    There were no vitals filed for this visit.  Visit Diagnosis:  Right knee pain - Plan: PT plan of care cert/re-cert  Right leg weakness - Plan: PT plan of care cert/re-cert  Difficulty navigating stairs - Plan: PT plan of care cert/re-cert  Fair tolerance for ambulation - Plan: PT plan of care cert/re-cert      Subjective Assessment - 04/06/15 1418    Subjective While playing tennis on 03/14/15 injured her Rt knee. Went to her physician and diagnosed with lateral meniscus tear. Very active and wanting to get back to very active lifestyle.    Pertinent History Rt THA   Limitations Walking   How long can you sit comfortably? unlimited   How long can you stand comfortably? unlimited   How long can you walk comfortably? 10 minutes   Diagnostic tests ultrasound   Patient Stated Goals Get back to hiking and tennis   Currently in Pain? Yes   Pain Score 1    Pain Location Knee   Pain Orientation Right   Pain Descriptors / Indicators Sharp   Pain Type Acute pain   Pain Onset 1 to 4 weeks  ago   Pain Frequency Intermittent  activity dependent   Aggravating Factors  walking, stairs   Pain Relieving Factors changing position            [facility] PT Assessment - 04/06/15 0001    Assessment   Medical Diagnosis Rt knee pain, acute lateral meniscal tear   Referring Provider Lilia Argue, DO   Onset Date/Surgical Date 03/14/15   Next MD Visit 04/28/15   Prior Therapy not for this   Precautions   Precaution Comments activity as tolerated   Restrictions   Weight Bearing Restrictions No   Balance Screen   Has the patient fallen in the past 6 months No   [place] residence   Prior Function   Level of Independence Independent   Cognition   Overall Cognitive Status Within Functional Limits for tasks assessed   Observation/Other Assessments   Observations no significant deviations in posture or feet alignment   Focus on Therapeutic Outcomes (FOTO)  53% Limitation   Sensation   Light Touch Appears Intact   Posture/Postural Control   Posture/Postural Control No significant limitations   AROM   Right Knee Extension 2   Right Knee Flexion 130   Strength   Right Hip ABduction 4-/5   Right Hip ADduction 4+/5   Right Knee Flexion  4-/5   Right Knee Extension 4-/5   Left Knee Flexion 5/5   Left Knee Extension 5/5   Flexibility   Soft Tissue Assessment /Muscle Length yes   Hamstrings good lenght on Rt   Palpation   Palpation comment discomfort at rt knee lateral joint line and at lateral femoral epicondyle   Special Tests    Special Tests Knee Special Tests   other    Findings Negative   Side  Right   Comments Lachmans, posterior drawer, MCL stress test   other   findings Positive   Side Right   Comments LCL stress test   Ambulation/Gait   Gait Comments uncompensated pattern                   [facility] Adult PT Treatment/Exercise - 04/06/15 0001    Knee/Hip Exercises: Standing   Terminal Knee Extension  Strengthening;Right;1 set;10 reps   Theraband Level (Terminal Knee Extension) Level 1 (Yellow)   Terminal Knee Extension Limitations with lateral shift to rt, 5 second holds   Other Standing Knee Exercises 4 way SLR with grade 1 band (focus on Rt quad activation in stance)   Knee/Hip Exercises: Sidelying   Hip ABduction Strengthening;Right;1 set;10 reps   Hip ABduction Limitations cues for extension to neutral                PT Education - 04/06/15 1535    Education provided Yes   Education Details HEP, POC, activity without pain   Person(s) Educated Patient   Methods Explanation;Demonstration;Tactile cues;Verbal cues;Handout   Comprehension Verbalized understanding;Returned demonstration          PT Short Term Goals - 04/06/15 1548    PT SHORT TERM GOAL #1   Title Patient to be independent with initial HEP for strengthening program.    Time 3   Period Weeks   Status New   PT SHORT TERM GOAL #2   Title Patient to report ability to ambulate for 20 minutes without taking a seated break.    Time 3   Period Days   Status New   PT SHORT TERM GOAL #3   Title Patient to amulate up/down three, 4 inch steps step over step.    Time 3   Period Weeks   Status New           PT Long Term Goals - 04/06/15 1551    PT LONG TERM GOAL #1   Title Patient to be independent with an advanced HEP for LE strengthening for continuation of gains upon D/C from PT.    Time 8   Period Weeks   Status New   PT LONG TERM GOAL #2   Title Patient to demonstrate 4+/5 strength with rt knee extension for knee stability with activity.    Time 8   Period Weeks   Status New   PT LONG TERM GOAL #3   Title Patient to report ability to return to playing tennis.    Time 8   Period Weeks   Status New   PT LONG TERM GOAL #4   Title Patient to amulate up/down 8 steps without rail and with step over step pattern.    Time 8   Period Weeks   Status New   PT LONG TERM GOAL #5   Title Patient to  report ability to ambulate X40 minutes without a seated rest.   Time 8   Period Weeks   Status New  Plan - 04/15/15 1540    Clinical Impression Statement Patient is a 69 y.o. female who reports injuring her rt knee while playing tennis on 03/14/15. She has since had an evaluation with Dr. Micheline Chapman who diagnosed her with an acute lateral meniscal tear. Since that time she has been working on an aquatic exercise program but is now hoping to start some more formal strengthening. At this time the patient presents with decreasd strength through the rt LE at both the thigh and hip musculature along with ongoing pain with activity. Functionaly she states she is limited with ambulation and stairs primarily. Overall the patient is appropriate for further PT sessions to address these deficits.    Pt will benefit from skilled therapeutic intervention in order to improve on the following deficits Decreased activity tolerance;Decreased mobility;Decreased strength;Difficulty walking;Pain   Rehab Potential Good   PT Frequency 1x / week   PT Duration 8 weeks   PT Treatment/Interventions ADLs/Self Care Home Management;Cryotherapy;Electrical Stimulation;Iontophoresis 4mg /ml Dexamethasone;Moist Heat;Ultrasound;Patient/family education;Taping;Dry needling;Therapeutic exercise;Therapeutic activities;Functional mobility training;Manual techniques;Gait training;Stair training   PT Next Visit Plan Review HEP, may be able to progress with minisquats, attempt nu-step if painfree. Include rt hip abduction strengthening.    PT Home Exercise Plan Check standing 4way SLR- focus on Rt LE stance, add minisquats if appropriate.    Consulted and Agree with Plan of Care Patient          G-Codes - 2015/04/15 1557    Functional Assessment Tool Used FOTO, clinical judgment   Functional Limitation Mobility: Walking and moving around   Mobility: Walking and Moving Around Current Status [phone]) At least 40 percent  but less than 60 percent impaired, limited or restricted   Mobility: Walking and Moving Around Goal Status [phone]) At least 20 percent but less than 40 percent impaired, limited or restricted       Problem List Patient Active Problem List   Diagnosis Date Noted  . Cyst of anterior horn of lateral meniscus of left knee 11/28/2011    Linard Millers, PT 04/15/2015, 4:01 PM  [facility] [address] [place], [place], [address] Phone: [phone]   Fax:  [phone]  Name: Regina [name] MRN: [id number] Date of Birth: 02-12-1946

## 2015-04-07 DIAGNOSIS — M25561 Pain in right knee: Secondary | ICD-10-CM | POA: Diagnosis not present

## 2015-04-12 ENCOUNTER — Ambulatory Visit: Payer: Medicare Other | Admitting: Physical Therapy

## 2015-04-12 DIAGNOSIS — Z789 Other specified health status: Secondary | ICD-10-CM

## 2015-04-12 DIAGNOSIS — M25561 Pain in right knee: Secondary | ICD-10-CM

## 2015-04-12 DIAGNOSIS — R6889 Other general symptoms and signs: Secondary | ICD-10-CM

## 2015-04-12 DIAGNOSIS — Z658 Other specified problems related to psychosocial circumstances: Secondary | ICD-10-CM | POA: Diagnosis not present

## 2015-04-12 DIAGNOSIS — R29898 Other symptoms and signs involving the musculoskeletal system: Secondary | ICD-10-CM | POA: Diagnosis not present

## 2015-04-12 NOTE — Therapy (Signed)
[place], [place], [address] Phone: [phone]   Fax:  [phone]  Physical Therapy Treatment  Patient Details  Name: Regina [name] MRN: [id number] Date of Birth: [birth year] Referring Provider: Lilia Argue, DO  Encounter Date: 04/12/2015      PT End of Session - 04/12/15 1629    Visit Number 2   Number of Visits 8   Date for PT Re-Evaluation 06/01/15   Authorization Type medicare g-coded by 9th visit, KX modifier at 15th visit.    PT Start Time 1540   PT Stop Time 1627   PT Time Calculation (min) 47 min   Activity Tolerance Patient tolerated treatment well   Behavior During Therapy WFL for tasks assessed/performed      Past Medical History  Diagnosis Date  . Arthritis     Past Surgical History  Procedure Laterality Date  . Replacement total hip w/  resurfacing implants  2005    There were no vitals filed for this visit.  Visit Diagnosis:  Right knee pain  Right leg weakness  Difficulty navigating stairs  Fair tolerance for ambulation      Subjective Assessment - 04/12/15 1539    Subjective Sometimes the knee seems better and sometimes it seems worse. Went to the PA and it might not be a torn meniscus.    Currently in Pain? No/denies   Aggravating Factors  walking and stairs   Pain Relieving Factors changing positions                         [facility] Adult PT Treatment/Exercise - 04/12/15 0001    Knee/Hip Exercises: Aerobic   Nustep L3 X 5 min   Knee/Hip Exercises: Standing   Other Standing Knee Exercises mini squats with weight shifts 2X10    Knee/Hip Exercises: Supine   Quad Sets Strengthening;Right;2 sets;10 reps  red band loop   Single Leg Bridge Both;2 sets;5 reps                PT Education - 04/12/15 1629    Education provided Yes   Education Details HEP   Person(s) Educated Patient   Methods Explanation;Demonstration;Tactile  cues;Verbal cues;Handout   Comprehension Verbalized understanding;Returned demonstration          PT Short Term Goals - 04/06/15 1548    PT SHORT TERM GOAL #1   Title Patient to be independent with initial HEP for strengthening program.    Time 3   Period Weeks   Status New   PT SHORT TERM GOAL #2   Title Patient to report ability to ambulate for 20 minutes without taking a seated break.    Time 3   Period Days   Status New   PT SHORT TERM GOAL #3   Title Patient to amulate up/down three, 4 inch steps step over step.    Time 3   Period Weeks   Status New           PT Long Term Goals - 04/06/15 1551    PT LONG TERM GOAL #1   Title Patient to be independent with an advanced HEP for LE strengthening for continuation of gains upon D/C from PT.    Time 8   Period Weeks   Status New   PT LONG TERM GOAL #2   Title Patient to demonstrate 4+/5 strength with rt knee extension for knee stability with activity.    Time 8  Period Weeks   Status New   PT LONG TERM GOAL #3   Title Patient to report ability to return to playing tennis.    Time 8   Period Weeks   Status New   PT LONG TERM GOAL #4   Title Patient to amulate up/down 8 steps without rail and with step over step pattern.    Time 8   Period Weeks   Status New   PT LONG TERM GOAL #5   Title Patient to report ability to ambulate X40 minutes without a seated rest.   Time 8   Period Weeks   Status New               Plan - 04/12/15 1650    Clinical Impression Statement Treatment focus is on establishing and progressing a LE strengthening program to assist the patient to return to her regular activities. Patient able to perform the current exercises without increasing pain. Heavy cues were needed with mini squat for technique. Will continue to progress the patient as tolerated future sessions with an active based exercise program.    PT Next Visit Plan Check HEP- ensure proper technique. Modify as needed if  having pain. Focus on Rt LE strengthening as tolerated. (prior Rt THA).    PT Home Exercise Plan Check HEP- assess mini squat technique and modify as needed, can increase depth if possible. Progress hip abductor strength keeping in mind Rt THA. Possible trial low step-ups.         Problem List Patient Active Problem List   Diagnosis Date Noted  . Cyst of anterior horn of lateral meniscus of left knee 11/28/2011    Linard Millers, PT 04/12/2015, 4:57 PM  [facility] [address] [place], [place], [address] Phone: [phone]   Fax:  [phone]  Name: Regina [name] MRN: [id number] Date of Birth: [birth year]

## 2015-04-13 DIAGNOSIS — H2513 Age-related nuclear cataract, bilateral: Secondary | ICD-10-CM | POA: Diagnosis not present

## 2015-04-20 ENCOUNTER — Ambulatory Visit: Payer: Medicare Other | Attending: Family Medicine | Admitting: Physical Therapy

## 2015-04-20 DIAGNOSIS — M25561 Pain in right knee: Secondary | ICD-10-CM | POA: Diagnosis not present

## 2015-04-20 DIAGNOSIS — R29898 Other symptoms and signs involving the musculoskeletal system: Secondary | ICD-10-CM | POA: Diagnosis not present

## 2015-04-20 DIAGNOSIS — Z658 Other specified problems related to psychosocial circumstances: Secondary | ICD-10-CM | POA: Diagnosis not present

## 2015-04-20 DIAGNOSIS — Z789 Other specified health status: Secondary | ICD-10-CM | POA: Diagnosis not present

## 2015-04-20 DIAGNOSIS — R6889 Other general symptoms and signs: Secondary | ICD-10-CM

## 2015-04-20 NOTE — Therapy (Signed)
[place], [place], [address] Phone: [phone]   Fax:  [phone]  Physical Therapy Treatment  Patient Details  Name: Regina [name] MRN: [id number] Date of Birth: [birth year] Referring Provider: Lilia Argue, DO  Encounter Date: 04/20/2015      PT End of Session - 04/20/15 1636    Visit Number 3   Number of Visits 8   Date for PT Re-Evaluation 06/01/15   Authorization Type medicare g-coded by 9th visit, KX modifier at 15th visit.    PT Start Time 1546   PT Stop Time 1630   PT Time Calculation (min) 44 min   Activity Tolerance Patient tolerated treatment well   Behavior During Therapy WFL for tasks assessed/performed      Past Medical History  Diagnosis Date  . Arthritis     Past Surgical History  Procedure Laterality Date  . Replacement total hip w/  resurfacing implants  2005    There were no vitals filed for this visit.  Visit Diagnosis:  Right knee pain  Right leg weakness  Difficulty navigating stairs  Fair tolerance for ambulation      Subjective Assessment - 04/20/15 1546    Subjective Knee pain is intermittant, worse when walking. Still working in the pool on exercises.    Currently in Pain? No/denies  get worse when walking 3/10   Aggravating Factors  walking   Pain Relieving Factors sitting                         [facility] Adult PT Treatment/Exercise - 04/20/15 0001    Ambulation/Gait   Gait Comments up/down 12 6 inch steps no rail step over step   Knee/Hip Exercises: Aerobic   Recumbent Bike L3 X 6 min   Knee/Hip Exercises: Standing   Lateral Step Up 2 sets;Right;Step Height: 4";Step Height: 6";10 reps   Lateral Step Up Limitations verbal and tactile cues   Forward Step Up 2 sets;10 reps;Step Height: 4";Step Height: 6";Right   Forward Step Up Limitations verbal and tactile cues   Other Standing Knee Exercises mini squats with weight shifts 1X10    Other Standing Knee Exercises minisquats 60 degree knee flexion   Knee/Hip Exercises: Sidelying   Clams 1X10 red  yellow at home for incresed range, hand assist as needed                PT Education - 04/20/15 1636    Education provided Yes   Education Details squat technique and variations to trial at home, stairs and technique. Patient declined handout of squats and stepup for HEP   Person(s) Educated Patient   Methods Explanation;Demonstration;Tactile cues;Verbal cues   Comprehension Verbalized understanding;Returned demonstration          PT Short Term Goals - 04/06/15 1548    PT SHORT TERM GOAL #1   Title Patient to be independent with initial HEP for strengthening program.    Time 3   Period Weeks   Status New   PT SHORT TERM GOAL #2   Title Patient to report ability to ambulate for 20 minutes without taking a seated break.    Time 3   Period Days   Status New   PT SHORT TERM GOAL #3   Title Patient to amulate up/down three, 4 inch steps step over step.    Time 3   Period Weeks   Status New  PT Long Term Goals - 04/06/15 1551    PT LONG TERM GOAL #1   Title Patient to be independent with an advanced HEP for LE strengthening for continuation of gains upon D/C from PT.    Time 8   Period Weeks   Status New   PT LONG TERM GOAL #2   Title Patient to demonstrate 4+/5 strength with rt knee extension for knee stability with activity.    Time 8   Period Weeks   Status New   PT LONG TERM GOAL #3   Title Patient to report ability to return to playing tennis.    Time 8   Period Weeks   Status New   PT LONG TERM GOAL #4   Title Patient to amulate up/down 8 steps without rail and with step over step pattern.    Time 8   Period Weeks   Status New   PT LONG TERM GOAL #5   Title Patient to report ability to ambulate X40 minutes without a seated rest.   Time 8   Period Weeks   Status New               Plan - 04/20/15 1638    Clinical  Impression Statement Patient reports that she is still having some right knee pain but is more anterior now than before, does not feel like it is in the meniscus region. Discussed that pain is possibly related to arthritic changes which the patient reports having experiences prior to the most recent injury. During this session the patient was able to increase her squat depth and advance to step-ups. Patient also able to do steps with step-over step pattern for the first time since the injury as well. Overall the patient has been making steady progress with function and strength. She is scheduled for her f/u visit with her physician prior to her next visit. Will continue as appropriate depending on results of appointment.    PT Next Visit Plan Results of MD visit and response to squats and step-ups. Discuss patient's progress an ongoing needs for PT vs HEP.    PT Home Exercise Plan check squats and step-ups.    Consulted and Agree with Plan of Care Patient        Problem List Patient Active Problem List   Diagnosis Date Noted  . Cyst of anterior horn of lateral meniscus of left knee 11/28/2011    Linard Millers, PT 04/20/2015, 4:45 PM  [facility] [address] [place], [place], [place], [address] Phone: [phone]   Fax:  [phone]  Name: Regina [name] MRN: [id number] Date of Birth: 16-Dec-1945

## 2015-04-28 ENCOUNTER — Encounter: Payer: Self-pay | Admitting: Sports Medicine

## 2015-04-28 ENCOUNTER — Ambulatory Visit (INDEPENDENT_AMBULATORY_CARE_PROVIDER_SITE_OTHER): Payer: Medicare Other | Admitting: Sports Medicine

## 2015-04-28 ENCOUNTER — Ambulatory Visit: Payer: Medicare Other | Admitting: Physical Therapy

## 2015-04-28 VITALS — BP 118/70 | Ht 61.0 in | Wt 125.0 lb

## 2015-04-28 DIAGNOSIS — Z789 Other specified health status: Secondary | ICD-10-CM

## 2015-04-28 DIAGNOSIS — R29898 Other symptoms and signs involving the musculoskeletal system: Secondary | ICD-10-CM

## 2015-04-28 DIAGNOSIS — R6889 Other general symptoms and signs: Secondary | ICD-10-CM

## 2015-04-28 DIAGNOSIS — Z658 Other specified problems related to psychosocial circumstances: Secondary | ICD-10-CM | POA: Diagnosis not present

## 2015-04-28 DIAGNOSIS — M25561 Pain in right knee: Secondary | ICD-10-CM | POA: Diagnosis not present

## 2015-04-28 NOTE — Progress Notes (Signed)
   Subjective:    Patient ID: Regina [name], female    DOB: 1945/06/07, 70 y.o.   MRN: [id number]  HPI   Patient comes in today for follow-up on her right knee pain. She injured the knee about 6 weeks ago while playing tennis. Her pain has improved. She has been going to physical therapy. She has not been playing tennis. She has not noticed any swelling. Taking Celebrex only as needed. No locking or catching. She is anxious to return to tennis.    Review of Systems     Objective:   Physical Exam Well-developed, well-nourished. No acute distress. Awake alert and oriented 3.  Right knee: Full range of motion. No effusion. There is still some tenderness to palpation along the lateral joint line but a negative McMurray's. No tenderness along the medial joint line. Neurovascularly intact distally. Walking without a limp.  [facility] ultrasound was performed today. Limited images of the right knee were obtained and compared to prior images. No effusion is seen. The irregularity seen in the periphery of the lateral meniscus on her prior ultrasound is no longer seen.       Assessment & Plan:  Improving right knee pain secondary to healing lateral meniscal tear  Patient has made good progress. I think she can do some simple easy ground strokes but she is not ready to start playing tennis. I think it will be another 6 weeks before she is able to return to the courts. She needs to continue with her physical therapy and will follow-up with me in 4 weeks. Continue with her body helix compression sleeve with activity. Continue with Celebrex as needed. Call me with questions or concerns in the interim.

## 2015-04-29 NOTE — Therapy (Addendum)
[place], [place], [address] Phone: [phone]   Fax:  [phone]  Physical Therapy Treatment  Patient Details  Name: Regina [name] MRN: [id number] Date of Birth: 1945/06/29 Referring Provider: Lilia Argue, DO  Encounter Date: 04/28/2015      PT End of Session - 04/28/15 0956    Visit Number 4   Number of Visits 8   Date for PT Re-Evaluation 06/01/15   Authorization Type medicare g-coded by 9th visit, KX modifier at 15th visit.    PT Start Time 0350   PT Stop Time 0428   PT Time Calculation (min) 38 min      Past Medical History  Diagnosis Date  . Arthritis     Past Surgical History  Procedure Laterality Date  . Replacement total hip w/  resurfacing implants  2005    There were no vitals filed for this visit.  Visit Diagnosis:  Right knee pain  Right leg weakness  Difficulty navigating stairs  Fair tolerance for ambulation      Subjective Assessment - 04/28/15 1556    Subjective Always 2-3/10 pain when I am upright. Had unultrasound this morning that showed meniscal healing.    Currently in Pain? Yes   Pain Score 2   or 3/10   Pain Location Knee   Pain Orientation Right   Pain Descriptors / Indicators Aching       Standing 4 way hip with red band x 10 bilateral each way- no increased pain  Standing step ups 6 inch forward and lateral with no increased pain Mini squats to ~ 60 degrees as well as with weight shifts to left and right per HEP- No increased pain unless ~60 degrees knee flexion with squat.   Strength Hip abduction 4+/5, kneee flexion and extension 4+/5 improved from 4-/5 on initial evaluation       PT Short Term Goals - 04/28/15 1559    PT SHORT TERM GOAL #1   Title Patient to be independent with initial HEP for strengthening program.    Time 3   Period Weeks   Status Achieved   PT SHORT TERM GOAL #2   Title Patient to report ability to ambulate for  20 minutes without taking a seated break.    Time 3   Period Days   Status Achieved   PT SHORT TERM GOAL #3   Title Patient to amulate up/down three, 4 inch steps step over step.    Time 3   Period Weeks   Status Achieved           PT Long Term Goals - 04/28/15 1600    PT LONG TERM GOAL #1   Title Patient to be independent with an advanced HEP for LE strengthening for continuation of gains upon D/C from PT.    Time 8   Period Weeks   Status On-going   PT LONG TERM GOAL #2   Title Patient to demonstrate 4+/5 strength with rt knee extension for knee stability with activity.    Time 8   Period Weeks   Status Achieved   PT LONG TERM GOAL #3   Title Patient to report ability to return to playing tennis.    Time 8   Period Weeks   Status On-going   PT LONG TERM GOAL #4   Title Patient to amulate up/down 8 steps without rail and with step over step pattern.    Time 8  Period Weeks   Status Achieved   PT LONG TERM GOAL #5   Title Patient to report ability to ambulate X40 minutes without a seated rest.   Time 8   Period Weeks   Status Achieved               Plan - 04/28/15 0957    Clinical Impression Statement Patient has met all goals except return to playing tennis. MD advised her this morning to wait 6 more weeks. She is allowed to try ground strokes. Pt is eager to return but plans to attempt some low level tennis moves over the coming weeks.  She is independent with a closed chain HEP including step up and mini squates. Added 4 way standing hip today with red theraband. Will assess tolerance to this exercise last visit as weill as discuss activities she performs in the next week and her pain lvel with those. She maintains 2-3/ 10 pain in the right knee with all activities in weightbearing. She is very active and has already exercised on her recumbent bike, in the pool and will go for a walk later today.    PT Next Visit Plan Likely DC next visit.          Problem List Patient Active Problem List   Diagnosis Date Noted  . Cyst of anterior horn of lateral meniscus of left knee 11/28/2011    Dorene Ar, PTA 04/29/2015, 10:02 AM  [facility] [address] [place], [place], [address] Phone: [phone]   Fax:  [phone]  Name: Regina [name] MRN: [id number] Date of Birth: 1946-01-01    PHYSICAL THERAPY DISCHARGE SUMMARY  Visits from Start of Care: 4  Current functional level related to goals / functional outcomes: See above   Remaining deficits: See above   Education / Equipment: HEP, activity modification  Plan: Patient agrees to discharge.  Patient goals were met. Patient is being discharged due to meeting the stated rehab goals.  ?????       Pt called to cancel her last appointment stating she did not need PT anymore.  Romualdo Bolk, PT, DPT 06/17/2015 12:32 PM Phone: [phone] Fax: [phone]

## 2015-05-05 ENCOUNTER — Encounter: Payer: Medicare Other | Admitting: Physical Therapy

## 2015-05-30 ENCOUNTER — Ambulatory Visit (INDEPENDENT_AMBULATORY_CARE_PROVIDER_SITE_OTHER): Payer: Medicare Other | Admitting: Sports Medicine

## 2015-05-30 ENCOUNTER — Encounter: Payer: Self-pay | Admitting: Sports Medicine

## 2015-05-30 VITALS — BP 116/64 | Ht 61.0 in | Wt 126.0 lb

## 2015-05-30 DIAGNOSIS — E785 Hyperlipidemia, unspecified: Secondary | ICD-10-CM | POA: Diagnosis not present

## 2015-05-30 DIAGNOSIS — S83281D Other tear of lateral meniscus, current injury, right knee, subsequent encounter: Secondary | ICD-10-CM

## 2015-05-30 DIAGNOSIS — S83281A Other tear of lateral meniscus, current injury, right knee, initial encounter: Secondary | ICD-10-CM | POA: Insufficient documentation

## 2015-05-30 DIAGNOSIS — R5383 Other fatigue: Secondary | ICD-10-CM | POA: Diagnosis not present

## 2015-05-30 DIAGNOSIS — Z0000 Encounter for general adult medical examination without abnormal findings: Secondary | ICD-10-CM | POA: Diagnosis not present

## 2015-05-30 DIAGNOSIS — F330 Major depressive disorder, recurrent, mild: Secondary | ICD-10-CM | POA: Diagnosis not present

## 2015-05-30 NOTE — Progress Notes (Signed)
   Subjective:    Patient ID: Regina [name], female    DOB: 1945-09-09, 70 y.o.   MRN: [id number]  HPI   Patient comes in today for follow-up on her right knee pain. She injured the knee about 10 weeks ago while playing tennis. Her pain has improved. She has finished physical therapy. She has not been playing tennis. She has been hiking (3 miles Saturday, 4 Sunday) with some soreness afterward but tolerable without medications. She has not noticed any swelling. Taking Celebrex only as needed, very rarely. No locking or catching. She is anxious to return to tennis.  Review of Systems See HPI    Objective:   Physical Exam Well-developed, well-nourished. No acute distress. Awake alert and oriented 3.  Right knee: Full range of motion. No effusion. There is still some mild tenderness to palpation along the lateral joint line but a negative McMurray's. Negative Thessaly's. No tenderness along the medial joint line. Neurovascularly intact distally. Walking without a limp.      Assessment & Plan:  Tear of lateral meniscus of right knee Improving right knee pain secondary to healing lateral meniscal tear  Patient has made good progress. Cleared for gradual return to playing tennis, would play only soft court. Continue with her body helix compression sleeve with activity. Continue with Celebrex as needed. Call me with questions or concerns or if pain worsens rather than continuing to improve as I expect.   Patient seen and evaluated with the resident. I agree with the above plan of care. Patient may resume activity as tolerated including tendons. Follow-up with me as needed.

## 2015-05-30 NOTE — Assessment & Plan Note (Signed)
Improving right knee pain secondary to healing lateral meniscal tear  Patient has made good progress. Cleared for gradual return to playing tennis, would play only soft court. Continue with her body helix compression sleeve with activity. Continue with Celebrex as needed. Call me with questions or concerns or if pain worsens rather than continuing to improve as I expect.

## 2015-06-08 DIAGNOSIS — M25511 Pain in right shoulder: Secondary | ICD-10-CM | POA: Diagnosis not present

## 2015-09-28 DIAGNOSIS — Z803 Family history of malignant neoplasm of breast: Secondary | ICD-10-CM | POA: Diagnosis not present

## 2015-09-28 DIAGNOSIS — Z1231 Encounter for screening mammogram for malignant neoplasm of breast: Secondary | ICD-10-CM | POA: Diagnosis not present

## 2016-04-13 DIAGNOSIS — H2513 Age-related nuclear cataract, bilateral: Secondary | ICD-10-CM | POA: Diagnosis not present

## 2016-04-13 DIAGNOSIS — H25013 Cortical age-related cataract, bilateral: Secondary | ICD-10-CM | POA: Diagnosis not present

## 2016-04-13 DIAGNOSIS — H5213 Myopia, bilateral: Secondary | ICD-10-CM | POA: Diagnosis not present

## 2017-06-04 DIAGNOSIS — E785 Hyperlipidemia, unspecified: Secondary | ICD-10-CM | POA: Diagnosis not present

## 2017-06-04 DIAGNOSIS — Z5181 Encounter for therapeutic drug level monitoring: Secondary | ICD-10-CM | POA: Diagnosis not present

## 2017-06-04 DIAGNOSIS — Z0000 Encounter for general adult medical examination without abnormal findings: Secondary | ICD-10-CM | POA: Diagnosis not present

## 2017-09-16 DIAGNOSIS — C439 Malignant melanoma of skin, unspecified: Secondary | ICD-10-CM

## 2017-09-16 DIAGNOSIS — C4362 Malignant melanoma of left upper limb, including shoulder: Secondary | ICD-10-CM | POA: Diagnosis not present

## 2017-09-16 DIAGNOSIS — D0372 Melanoma in situ of left lower limb, including hip: Secondary | ICD-10-CM | POA: Diagnosis not present

## 2017-09-16 DIAGNOSIS — L309 Dermatitis, unspecified: Secondary | ICD-10-CM | POA: Diagnosis not present

## 2017-09-16 HISTORY — DX: Malignant melanoma of skin, unspecified: C43.9

## 2017-10-03 DIAGNOSIS — D0372 Melanoma in situ of left lower limb, including hip: Secondary | ICD-10-CM | POA: Diagnosis not present

## 2017-10-03 DIAGNOSIS — L988 Other specified disorders of the skin and subcutaneous tissue: Secondary | ICD-10-CM | POA: Diagnosis not present

## 2017-10-09 DIAGNOSIS — Z803 Family history of malignant neoplasm of breast: Secondary | ICD-10-CM | POA: Diagnosis not present

## 2017-10-09 DIAGNOSIS — Z1231 Encounter for screening mammogram for malignant neoplasm of breast: Secondary | ICD-10-CM | POA: Diagnosis not present

## 2017-10-15 DIAGNOSIS — Z4802 Encounter for removal of sutures: Secondary | ICD-10-CM | POA: Diagnosis not present

## 2017-10-15 DIAGNOSIS — L089 Local infection of the skin and subcutaneous tissue, unspecified: Secondary | ICD-10-CM | POA: Diagnosis not present

## 2017-10-28 DIAGNOSIS — L309 Dermatitis, unspecified: Secondary | ICD-10-CM | POA: Diagnosis not present

## 2017-10-28 DIAGNOSIS — Z85820 Personal history of malignant melanoma of skin: Secondary | ICD-10-CM | POA: Diagnosis not present

## 2017-10-28 DIAGNOSIS — D229 Melanocytic nevi, unspecified: Secondary | ICD-10-CM | POA: Diagnosis not present

## 2017-12-26 DIAGNOSIS — R002 Palpitations: Secondary | ICD-10-CM | POA: Diagnosis not present

## 2018-01-09 ENCOUNTER — Ambulatory Visit (INDEPENDENT_AMBULATORY_CARE_PROVIDER_SITE_OTHER): Payer: Medicare Other

## 2018-01-09 ENCOUNTER — Other Ambulatory Visit: Payer: Self-pay | Admitting: Family Medicine

## 2018-01-09 DIAGNOSIS — R002 Palpitations: Secondary | ICD-10-CM

## 2018-01-17 DIAGNOSIS — M25552 Pain in left hip: Secondary | ICD-10-CM | POA: Diagnosis not present

## 2018-02-06 DIAGNOSIS — L570 Actinic keratosis: Secondary | ICD-10-CM | POA: Diagnosis not present

## 2018-02-06 DIAGNOSIS — L603 Nail dystrophy: Secondary | ICD-10-CM | POA: Diagnosis not present

## 2018-02-06 DIAGNOSIS — L814 Other melanin hyperpigmentation: Secondary | ICD-10-CM | POA: Diagnosis not present

## 2018-02-06 DIAGNOSIS — D485 Neoplasm of uncertain behavior of skin: Secondary | ICD-10-CM | POA: Diagnosis not present

## 2018-02-06 DIAGNOSIS — L300 Nummular dermatitis: Secondary | ICD-10-CM | POA: Diagnosis not present

## 2018-03-01 DIAGNOSIS — L853 Xerosis cutis: Secondary | ICD-10-CM | POA: Diagnosis not present

## 2018-03-05 DIAGNOSIS — M1610 Unilateral primary osteoarthritis, unspecified hip: Secondary | ICD-10-CM | POA: Diagnosis not present

## 2018-03-05 DIAGNOSIS — Z01818 Encounter for other preprocedural examination: Secondary | ICD-10-CM | POA: Diagnosis not present

## 2018-03-12 NOTE — Progress Notes (Signed)
12-26-17 (Epic) EKG

## 2018-03-12 NOTE — Patient Instructions (Signed)
ANNABELL OCONNOR  03/12/2018   Your procedure is scheduled on: 03-26-18    Report to [facility]  Entrance    Report to Admitting at 2: 25 PM    Call this number if you have problems the morning of surgery [phone]     Remember: Do not eat food or drink liquids :After Midnight.  You may have a Clear Liquid Diet from Midnight until 10:55 AM. After 10:55 AM, nothing until after surgery.     CLEAR LIQUID DIET   Foods Allowed                                                                     Foods Excluded  Coffee and tea, regular and decaf                             liquids that you cannot  Plain Jell-O in any flavor                                             see through such as: Fruit ices (not with fruit pulp)                                     milk, soups, orange juice  Iced Popsicles                                    All solid food Carbonated beverages, regular and diet                                    Cranberry, grape and apple juices Sports drinks like Gatorade Lightly seasoned clear broth or consume(fat free) Sugar, honey syrup  Sample Menu Breakfast                                Lunch                                     Supper Cranberry juice                    Beef broth                            Chicken broth Jell-O                                     Grape juice  Apple juice Coffee or tea                        Jell-O                                      Popsicle                                                Coffee or tea                        Coffee or tea  _____________________________________________________________________     BRUSH YOUR TEETH MORNING OF SURGERY AND RINSE YOUR MOUTH OUT, NO CHEWING GUM CANDY OR MINTS.     Take these medicines the morning of surgery with A SIP OF WATER: None                                You may not have any metal on your body including hair pins and           piercings  Do not wear jewelry, make-up, lotions, powders, cologne or deodorant             Do not wear nail polish.  Do not shave  48 hours prior to surgery.                 Do not bring valuables to the hospital. [place].  Contacts, dentures or bridgework may not be worn into surgery.  Leave suitcase in the car. After surgery it may be brought to your room.     Patients discharged the day of surgery will not be allowed to drive home.  Name and phone number of your driver:  Special Instructions: N/A              Please read over the following fact sheets you were given: _____________________________________________________________________             [facility] - Preparing for Surgery Before surgery, you can play an important role.  Because skin is not sterile, your skin needs to be as free of germs as possible.  You can reduce the number of germs on your skin by washing with CHG (chlorahexidine gluconate) soap before surgery.  CHG is an antiseptic cleaner which kills germs and bonds with the skin to continue killing germs even after washing. Please DO NOT use if you have an allergy to CHG or antibacterial soaps.  If your skin becomes reddened/irritated stop using the CHG and inform your nurse when you arrive at Short Stay. Do not shave (including legs and underarms) for at least 48 hours prior to the first CHG shower.  You may shave your face/neck. Please follow these instructions carefully:  1.  Shower with CHG Soap the night before surgery and the  morning of Surgery.  2.  If you choose to wash your hair, wash your hair first as usual with your  normal  shampoo.  3.  After you shampoo, rinse your hair and body thoroughly to remove the  shampoo.                           4.  Use CHG as you would any other liquid soap.  You can apply chg directly  to the skin and wash                       Gently with a scrungie or clean  washcloth.  5.  Apply the CHG Soap to your body ONLY FROM THE NECK DOWN.   Do not use on face/ open                           Wound or open sores. Avoid contact with eyes, ears mouth and genitals (private parts).                       Wash face,  Genitals (private parts) with your normal soap.             6.  Wash thoroughly, paying special attention to the area where your surgery  will be performed.  7.  Thoroughly rinse your body with warm water from the neck down.  8.  DO NOT shower/wash with your normal soap after using and rinsing off  the CHG Soap.                9.  Pat yourself dry with a clean towel.            10.  Wear clean pajamas.            11.  Place clean sheets on your bed the night of your first shower and do not  sleep with pets. Day of Surgery : Do not apply any lotions/deodorants the morning of surgery.  Please wear clean clothes to the hospital/surgery center.  FAILURE TO FOLLOW THESE INSTRUCTIONS MAY RESULT IN THE CANCELLATION OF YOUR SURGERY PATIENT SIGNATURE_________________________________  NURSE SIGNATURE__________________________________  ________________________________________________________________________   Adam Phenix  An incentive spirometer is a tool that can help keep your lungs clear and active. This tool measures how well you are filling your lungs with each breath. Taking long deep breaths may help reverse or decrease the chance of developing breathing (pulmonary) problems (especially infection) following:  A long period of time when you are unable to move or be active. BEFORE THE PROCEDURE   If the spirometer includes an indicator to show your best effort, your nurse or respiratory therapist will set it to a desired goal.  If possible, sit up straight or lean slightly forward. Try not to slouch.  Hold the incentive spirometer in an upright position. INSTRUCTIONS FOR USE  1. Sit on the edge of your bed if possible, or sit up as far  as you can in bed or on a chair. 2. Hold the incentive spirometer in an upright position. 3. Breathe out normally. 4. Place the mouthpiece in your mouth and seal your lips tightly around it. 5. Breathe in slowly and as deeply as possible, raising the piston or the ball toward the top of the column. 6. Hold your breath for 3-5 seconds or for as long as possible. Allow the piston or ball to fall to the bottom of the column. 7. Remove the mouthpiece from your mouth and breathe out normally. 8. Rest for a few seconds and repeat Steps 1 through 7 at least 10 times  every 1-2 hours when you are awake. Take your time and take a few normal breaths between deep breaths. 9. The spirometer may include an indicator to show your best effort. Use the indicator as a goal to work toward during each repetition. 10. After each set of 10 deep breaths, practice coughing to be sure your lungs are clear. If you have an incision (the cut made at the time of surgery), support your incision when coughing by placing a pillow or rolled up towels firmly against it. Once you are able to get out of bed, walk around indoors and cough well. You may stop using the incentive spirometer when instructed by your caregiver.  RISKS AND COMPLICATIONS  Take your time so you do not get dizzy or light-headed.  If you are in pain, you may need to take or ask for pain medication before doing incentive spirometry. It is harder to take a deep breath if you are having pain. AFTER USE  Rest and breathe slowly and easily.  It can be helpful to keep track of a log of your progress. Your caregiver can provide you with a simple table to help with this. If you are using the spirometer at home, follow these instructions: [place] IF:   You are having difficultly using the spirometer.  You have trouble using the spirometer as often as instructed.  Your pain medication is not giving enough relief while using the spirometer.  You  develop fever of 100.5 F (38.1 C) or higher. SEEK IMMEDIATE MEDICAL CARE IF:   You cough up bloody sputum that had not been present before.  You develop fever of 102 F (38.9 C) or greater.  You develop worsening pain at or near the incision site. MAKE SURE YOU:   Understand these instructions.  Will watch your condition.  Will get help right away if you are not doing well or get worse. Document Released: 08/13/2006 Document Revised: 06/25/2011 Document Reviewed: 10/14/2006 Exit[employer] Patient Information 2014 [employer], [place].   ________________________________________________________________________  WHAT IS A BLOOD TRANSFUSION? Blood Transfusion Information  A transfusion is the replacement of blood or some of its parts. Blood is made up of multiple cells which provide different functions.  Red blood cells carry oxygen and are used for blood loss replacement.  White blood cells fight against infection.  Platelets control bleeding.  Plasma helps clot blood.  Other blood products are available for specialized needs, such as hemophilia or other clotting disorders. BEFORE THE TRANSFUSION  Who gives blood for transfusions?   Healthy volunteers who are fully evaluated to make sure their blood is safe. This is blood bank blood. Transfusion therapy is the safest it has ever been in the practice of medicine. Before blood is taken from a donor, a complete history is taken to make sure that person has no history of diseases nor engages in risky social behavior (examples are intravenous drug use or sexual activity with multiple partners). The donor's travel history is screened to minimize risk of transmitting infections, such as malaria. The donated blood is tested for signs of infectious diseases, such as HIV and hepatitis. The blood is then tested to be sure it is compatible with you in order to minimize the chance of a transfusion reaction. If you or a relative donates blood, this is  often done in anticipation of surgery and is not appropriate for emergency situations. It takes many days to process the donated blood. RISKS AND COMPLICATIONS Although transfusion therapy is very  safe and saves many lives, the main dangers of transfusion include:   Getting an infectious disease.  Developing a transfusion reaction. This is an allergic reaction to something in the blood you were given. Every precaution is taken to prevent this. The decision to have a blood transfusion has been considered carefully by your caregiver before blood is given. Blood is not given unless the benefits outweigh the risks. AFTER THE TRANSFUSION  Right after receiving a blood transfusion, you will usually feel much better and more energetic. This is especially true if your red blood cells have gotten low (anemic). The transfusion raises the level of the red blood cells which carry oxygen, and this usually causes an energy increase.  The nurse administering the transfusion will monitor you carefully for complications. HOME CARE INSTRUCTIONS  No special instructions are needed after a transfusion. You may find your energy is better. Speak with your caregiver about any limitations on activity for underlying diseases you may have. SEEK MEDICAL CARE IF:   Your condition is not improving after your transfusion.  You develop redness or irritation at the intravenous (IV) site. SEEK IMMEDIATE MEDICAL CARE IF:  Any of the following symptoms occur over the next 12 hours:  Shaking chills.  You have a temperature by mouth above 102 F (38.9 C), not controlled by medicine.  Chest, back, or muscle pain.  People around you feel you are not acting correctly or are confused.  Shortness of breath or difficulty breathing.  Dizziness and fainting.  You get a rash or develop hives.  You have a decrease in urine output.  Your urine turns a dark color or changes to pink, red, or brown. Any of the following  symptoms occur over the next 10 days:  You have a temperature by mouth above 102 F (38.9 C), not controlled by medicine.  Shortness of breath.  Weakness after normal activity.  The white part of the eye turns yellow (jaundice).  You have a decrease in the amount of urine or are urinating less often.  Your urine turns a dark color or changes to pink, red, or brown. Document Released: 03/30/2000 Document Revised: 06/25/2011 Document Reviewed: 11/17/2007 [facility] Patient Information 2014 [place], [place].  _______________________________________________________________________

## 2018-03-17 ENCOUNTER — Other Ambulatory Visit: Payer: Self-pay

## 2018-03-17 ENCOUNTER — Encounter (HOSPITAL_COMMUNITY): Payer: Self-pay

## 2018-03-17 ENCOUNTER — Encounter (HOSPITAL_COMMUNITY)
Admission: RE | Admit: 2018-03-17 | Discharge: 2018-03-17 | Disposition: A | Discharge status: Home / Self Care | Payer: Medicare Other | Source: Ambulatory Visit | Attending: Orthopedic Surgery | Admitting: Orthopedic Surgery

## 2018-03-17 DIAGNOSIS — M1612 Unilateral primary osteoarthritis, left hip: Secondary | ICD-10-CM | POA: Diagnosis not present

## 2018-03-17 DIAGNOSIS — Z01812 Encounter for preprocedural laboratory examination: Secondary | ICD-10-CM | POA: Diagnosis not present

## 2018-03-17 HISTORY — DX: Other complications of anesthesia, initial encounter: T88.59XA

## 2018-03-17 HISTORY — DX: Other specified postprocedural states: Z98.890

## 2018-03-17 HISTORY — DX: Adverse effect of unspecified anesthetic, initial encounter: T41.45XA

## 2018-03-17 HISTORY — DX: Other specified postprocedural states: R11.2

## 2018-03-17 LAB — COMPREHENSIVE METABOLIC PANEL
ALBUMIN: 4.2 g/dL (ref 3.5–5.0)
ALK PHOS: 62 U/L (ref 38–126)
ALT: 14 U/L (ref 0–44)
ANION GAP: 7 (ref 5–15)
AST: 19 U/L (ref 15–41)
BUN: 19 mg/dL (ref 8–23)
CALCIUM: 9.2 mg/dL (ref 8.9–10.3)
CHLORIDE: 104 mmol/L (ref 98–111)
CO2: 29 mmol/L (ref 22–32)
Creatinine, Ser: 0.6 mg/dL (ref 0.44–1.00)
GFR calc non Af Amer: >60 mL/min (ref >60)
GLUCOSE: 97 mg/dL (ref 70–99)
Potassium: 4 mmol/L (ref 3.5–5.1)
Sodium: 140 mmol/L (ref 135–145)
Total Bilirubin: 0.5 mg/dL (ref 0.3–1.2)
Total Protein: 6.8 g/dL (ref 6.5–8.1)

## 2018-03-17 LAB — CBC
HCT: 39.3 % (ref 36.0–46.0)
HEMOGLOBIN: 12.9 g/dL (ref 12.0–15.0)
MCH: 33.2 pg (ref 26.0–34.0)
MCHC: 32.8 g/dL (ref 30.0–36.0)
MCV: 101 fL — ABNORMAL HIGH (ref 80.0–100.0)
PLATELETS: 281 10*3/uL (ref 150–400)
RBC: 3.89 MIL/uL (ref 3.87–5.11)
RDW: 11.9 % (ref 11.5–15.5)
WBC: 6.4 10*3/uL (ref 4.0–10.5)
nRBC: 0 % (ref 0.0–0.2)

## 2018-03-17 LAB — PROTIME-INR
INR: 0.9
Prothrombin Time: 12.1 seconds (ref 11.4–15.2)

## 2018-03-17 LAB — SURGICAL PCR SCREEN
MRSA, PCR: NEGATIVE
Staphylococcus aureus: NEGATIVE

## 2018-03-17 LAB — ABO/RH: ABO/RH(D): A POS

## 2018-03-17 LAB — APTT: APTT: 29 s (ref 24–36)

## 2018-03-18 NOTE — H&P (Signed)
TOTAL HIP ADMISSION H&P  Patient is admitted for left total hip arthroplasty.  Subjective:  Chief Complaint: left hip pain  HPI: Regina [name], 72 y.o. female, has a history of pain and functional disability in the left hip(s) due to arthritis and patient has failed non-surgical conservative treatments for greater than 12 weeks to include NSAID's and/or analgesics and activity modification.  Onset of symptoms was abrupt starting 5 months ago with gradually worsening course since that time.The patient noted no past surgery on the left hip(s).  Patient currently rates pain in the left hip at 7 out of 10 with activity. Patient has worsening of pain with activity and weight bearing and pain that interfers with activities of daily living. Patient has evidence of bone-on-bone arthritis with subchondral cystic formation and osteophyte formation by imaging studies. This condition presents safety issues increasing the risk of falls. There is no current active infection.  Patient Active Problem List   Diagnosis Date Noted  . Tear of lateral meniscus of right knee 05/30/2015   Past Medical History:  Diagnosis Date  . Arthritis   . Complication of anesthesia   . PONV (postoperative nausea and vomiting)     Past Surgical History:  Procedure Laterality Date  . REPLACEMENT TOTAL HIP W/  RESURFACING IMPLANTS  2005  . SHOULDER ADHESION RELEASE  1997    No current facility-administered medications for this encounter.    Current Outpatient Medications  Medication Sig Dispense Refill Last Dose  . ibuprofen (ADVIL,MOTRIN) 200 MG tablet Take 400 mg by mouth every 6 (six) hours as needed for moderate pain.      . Multiple Vitamin (MULTIVITAMIN WITH MINERALS) TABS tablet Take 1 tablet by mouth daily.      Allergies  Allergen Reactions  . Gluten Meal Swelling    Lip swelling  . Prometrium [Progesterone Micronized] Other (See Comments)    Neurological sx of stroke  . Shellfish Allergy Swelling    Lip  swelling    Social History   Tobacco Use  . Smoking status: Never Smoker  . Smokeless tobacco: Never Used  Substance Use Topics  . Alcohol use: Yes    Alcohol/week: 3.0 standard drinks    Types: 3 Glasses of wine per week    No family history on file.   Review of Systems  Constitutional: Negative for chills and fever.  HENT: Negative for congestion, sore throat and tinnitus.   Eyes: Negative for double vision, photophobia and pain.  Respiratory: Negative for cough, shortness of breath and wheezing.   Cardiovascular: Negative for chest pain, palpitations and orthopnea.  Gastrointestinal: Negative for heartburn, nausea and vomiting.  Genitourinary: Negative for dysuria, frequency and urgency.  Musculoskeletal: Positive for joint pain.  Neurological: Negative for dizziness, weakness and headaches.    Objective:  Physical Exam  Well nourished and well developed.  General: Alert and oriented x3, cooperative and pleasant, no acute distress.  Head: normocephalic, atraumatic, neck supple.  Eyes: EOMI.  Respiratory: breath sounds clear in all fields, no wheezing, rales, or rhonchi. Cardiovascular: Regular rate and rhythm, no murmurs, gallops or rubs.  Abdomen: non-tender to palpation and soft, normoactive bowel sounds. Musculoskeletal: Antalgic gait pattern favoring the left side without the use of assisted devices.  Right Hip Exam: ROM: Flexion to 130, Internal Rotation 30, External Rotation 40, and Abduction 40 without discomfort. There is no tenderness over the greater trochanter bursa.  Left Hip Exam: ROM: Flexion to 110, Internal Rotation 0, External Rotation 10-20, and Abduction  20 with pain and discomfort. There is no tenderness over the greater trochanter bursa.  Calves soft and nontender. Motor function intact in LE. Strength 5/5 LE bilaterally. Neuro: Distal pulses 2+. Sensation to light touch intact in LE.  Vital signs in last 24 hours:  Blood pressure: 140/76  mmHg Pulse: 68 bpm  Labs:   Estimated body mass index is 23.33 kg/m as calculated from the following:   Height as of 03/17/18: 5' 1.5" (1.562 m).   Weight as of 03/17/18: 56.9 kg.   Imaging Review Plain radiographs demonstrate severe degenerative joint disease of the left hip(s). The bone quality appears to be adequate for age and reported activity level.    Preoperative templating of the joint replacement has been completed, documented, and submitted to the Operating Room personnel in order to optimize intra-operative equipment management.     Assessment/Plan:  End stage arthritis, left hip(s)  The patient history, physical examination, clinical judgement of the provider and imaging studies are consistent with end stage degenerative joint disease of the left hip(s) and total hip arthroplasty is deemed medically necessary. The treatment options including medical management, injection therapy, arthroscopy and arthroplasty were discussed at length. The risks and benefits of total hip arthroplasty were presented and reviewed. The risks due to aseptic loosening, infection, stiffness, dislocation/subluxation,  thromboembolic complications and other imponderables were discussed.  The patient acknowledged the explanation, agreed to proceed with the plan and consent was signed. Patient is being admitted for inpatient treatment for surgery, pain control, PT, OT, prophylactic antibiotics, VTE prophylaxis, progressive ambulation and ADL's and discharge planning.The patient is planning to be discharged home with HEP.   Therapy Plans: HEP Disposition: Home with husband Planned DVT Prophylaxis: Xarelto 10 mg daily (hx melanoma) DME needed: None PCP: Dr. Justin Mend Cardiologist: Dr. Marlou Porch TXA: IV Allergies: Shellfish/iodine Anesthesia Concerns: Nausea/vomiting, difficulty arousing BMI: 23.8  - Patient was instructed on what medications to stop prior to surgery. - Follow-up visit in 2 weeks with Dr.  Wynelle Link - Begin physical therapy following surgery - Pre-operative lab work as pre-surgical testing - Prescriptions will be provided in hospital at time of discharge  Theresa Duty, PA-C Orthopedic Surgery [facility] [place]

## 2018-03-26 ENCOUNTER — Other Ambulatory Visit: Payer: Self-pay

## 2018-03-26 ENCOUNTER — Encounter (HOSPITAL_COMMUNITY)
Admission: RE | Disposition: A | Discharge status: Home / Self Care | Payer: Self-pay | Source: Home / Self Care | Attending: Orthopedic Surgery

## 2018-03-26 ENCOUNTER — Inpatient Hospital Stay (HOSPITAL_COMMUNITY): Payer: Medicare Other

## 2018-03-26 ENCOUNTER — Encounter (HOSPITAL_COMMUNITY): Payer: Self-pay | Admitting: General Practice

## 2018-03-26 ENCOUNTER — Inpatient Hospital Stay (HOSPITAL_COMMUNITY)
Admission: RE | Admit: 2018-03-26 | Discharge: 2018-03-27 | DRG: 470 | Disposition: A | Discharge status: Home / Self Care | Payer: Medicare Other | Attending: Orthopedic Surgery | Admitting: Orthopedic Surgery

## 2018-03-26 ENCOUNTER — Inpatient Hospital Stay (HOSPITAL_COMMUNITY): Payer: Medicare Other | Admitting: Certified Registered Nurse Anesthetist

## 2018-03-26 DIAGNOSIS — M25752 Osteophyte, left hip: Secondary | ICD-10-CM | POA: Diagnosis not present

## 2018-03-26 DIAGNOSIS — Z91018 Allergy to other foods: Secondary | ICD-10-CM

## 2018-03-26 DIAGNOSIS — Z96642 Presence of left artificial hip joint: Secondary | ICD-10-CM | POA: Diagnosis not present

## 2018-03-26 DIAGNOSIS — M25552 Pain in left hip: Secondary | ICD-10-CM | POA: Diagnosis not present

## 2018-03-26 DIAGNOSIS — Z96649 Presence of unspecified artificial hip joint: Secondary | ICD-10-CM

## 2018-03-26 DIAGNOSIS — M1612 Unilateral primary osteoarthritis, left hip: Secondary | ICD-10-CM | POA: Diagnosis not present

## 2018-03-26 DIAGNOSIS — Z888 Allergy status to other drugs, medicaments and biological substances status: Secondary | ICD-10-CM | POA: Diagnosis not present

## 2018-03-26 DIAGNOSIS — Z91013 Allergy to seafood: Secondary | ICD-10-CM | POA: Diagnosis not present

## 2018-03-26 DIAGNOSIS — M169 Osteoarthritis of hip, unspecified: Secondary | ICD-10-CM | POA: Diagnosis present

## 2018-03-26 DIAGNOSIS — Z471 Aftercare following joint replacement surgery: Secondary | ICD-10-CM | POA: Diagnosis not present

## 2018-03-26 HISTORY — PX: TOTAL HIP ARTHROPLASTY: SHX124

## 2018-03-26 LAB — TYPE AND SCREEN
ABO/RH(D): A POS
ANTIBODY SCREEN: NEGATIVE

## 2018-03-26 SURGERY — ARTHROPLASTY, HIP, TOTAL, ANTERIOR APPROACH
Anesthesia: Monitor Anesthesia Care | Site: Hip | Laterality: Left

## 2018-03-26 MED ORDER — BISACODYL 10 MG RE SUPP: 10.0000 mg | Freq: Every day | RECTAL | Status: DC | PRN

## 2018-03-26 MED ORDER — HYDROCODONE-ACETAMINOPHEN 5-325 MG PO TABS
1.0000 | ORAL_TABLET | ORAL | Status: DC | PRN
  Administered 2018-03-26 (×2): 1 via ORAL
  Administered 2018-03-27 (×2): 2 via ORAL
  Filled 2018-03-26 (×4): qty 2

## 2018-03-26 MED ORDER — DEXAMETHASONE SODIUM PHOSPHATE 10 MG/ML IJ SOLN
8.0000 mg | Freq: Once | INTRAMUSCULAR | Status: AC
  Administered 2018-03-26: 8 mg via INTRAVENOUS

## 2018-03-26 MED ORDER — DOCUSATE SODIUM 100 MG PO CAPS
100.0000 mg | ORAL_CAPSULE | Freq: Two times a day (BID) | ORAL | Status: DC
  Administered 2018-03-26 – 2018-03-27 (×2): 100 mg via ORAL
  Filled 2018-03-26 (×2): qty 1

## 2018-03-26 MED ORDER — PHENOL 1.4 % MT LIQD: 1.0000 | OROMUCOSAL | Status: DC | PRN

## 2018-03-26 MED ORDER — SODIUM CHLORIDE 0.9 % IV SOLN
INTRAVENOUS | Status: DC | PRN
  Administered 2018-03-26: 25 ug/min via INTRAVENOUS

## 2018-03-26 MED ORDER — SODIUM CHLORIDE 0.9 % IV SOLN
INTRAVENOUS | Status: DC
  Administered 2018-03-26 – 2018-03-27 (×2): via INTRAVENOUS

## 2018-03-26 MED ORDER — BUPIVACAINE IN DEXTROSE 0.75-8.25 % IT SOLN
INTRATHECAL | Status: DC | PRN
  Administered 2018-03-26: 1.6 mL via INTRATHECAL

## 2018-03-26 MED ORDER — BUPIVACAINE-EPINEPHRINE (PF) 0.25% -1:200000 IJ SOLN
INTRAMUSCULAR | Status: AC
  Filled 2018-03-26: qty 30

## 2018-03-26 MED ORDER — PROPOFOL 10 MG/ML IV BOLUS
INTRAVENOUS | Status: AC
  Filled 2018-03-26: qty 40

## 2018-03-26 MED ORDER — ACETAMINOPHEN 160 MG/5ML PO SOLN: 1000.0000 mg | Freq: Once | ORAL | Status: DC | PRN

## 2018-03-26 MED ORDER — ACETAMINOPHEN 500 MG PO TABS: 1000.0000 mg | ORAL_TABLET | Freq: Once | ORAL | Status: DC | PRN

## 2018-03-26 MED ORDER — BUPIVACAINE-EPINEPHRINE (PF) 0.25% -1:200000 IJ SOLN
INTRAMUSCULAR | Status: DC | PRN
  Administered 2018-03-26: 30 mL via PERINEURAL

## 2018-03-26 MED ORDER — FENTANYL CITRATE (PF) 100 MCG/2ML IJ SOLN
INTRAMUSCULAR | Status: AC
  Filled 2018-03-26: qty 2

## 2018-03-26 MED ORDER — POLYETHYLENE GLYCOL 3350 17 G PO PACK: 17.0000 g | PACK | Freq: Every day | ORAL | Status: DC | PRN

## 2018-03-26 MED ORDER — STERILE WATER FOR IRRIGATION IR SOLN
Status: DC | PRN
  Administered 2018-03-26: 2000 mL

## 2018-03-26 MED ORDER — DEXMEDETOMIDINE HCL 200 MCG/2ML IV SOLN
INTRAVENOUS | Status: DC | PRN
  Administered 2018-03-26: 8 ug via INTRAVENOUS

## 2018-03-26 MED ORDER — ACETAMINOPHEN 500 MG PO TABS
500.0000 mg | ORAL_TABLET | Freq: Four times a day (QID) | ORAL | Status: DC
  Administered 2018-03-26 – 2018-03-27 (×2): 500 mg via ORAL
  Filled 2018-03-26 (×2): qty 1

## 2018-03-26 MED ORDER — CEFAZOLIN SODIUM-DEXTROSE 1-4 GM/50ML-% IV SOLN
1.0000 g | Freq: Four times a day (QID) | INTRAVENOUS | Status: AC
  Administered 2018-03-26 – 2018-03-27 (×2): 1 g via INTRAVENOUS
  Filled 2018-03-26 (×3): qty 50

## 2018-03-26 MED ORDER — FENTANYL CITRATE (PF) 100 MCG/2ML IJ SOLN: 25.0000 ug | INTRAMUSCULAR | Status: DC | PRN

## 2018-03-26 MED ORDER — OXYCODONE HCL 5 MG PO TABS: 5.0000 mg | ORAL_TABLET | Freq: Once | ORAL | Status: DC | PRN

## 2018-03-26 MED ORDER — ONDANSETRON HCL 4 MG/2ML IJ SOLN
4.0000 mg | Freq: Four times a day (QID) | INTRAMUSCULAR | Status: DC | PRN
  Filled 2018-03-26: qty 2

## 2018-03-26 MED ORDER — METHOCARBAMOL 500 MG PO TABS
500.0000 mg | ORAL_TABLET | Freq: Four times a day (QID) | ORAL | Status: DC | PRN
  Administered 2018-03-26 – 2018-03-27 (×2): 500 mg via ORAL
  Filled 2018-03-26 (×2): qty 1

## 2018-03-26 MED ORDER — MENTHOL 3 MG MT LOZG: 1.0000 | LOZENGE | OROMUCOSAL | Status: DC | PRN

## 2018-03-26 MED ORDER — MORPHINE SULFATE (PF) 2 MG/ML IV SOLN: 0.5000 mg | INTRAVENOUS | Status: DC | PRN

## 2018-03-26 MED ORDER — EPHEDRINE SULFATE-NACL 50-0.9 MG/10ML-% IV SOSY
PREFILLED_SYRINGE | INTRAVENOUS | Status: DC | PRN
  Administered 2018-03-26: 10 mg via INTRAVENOUS

## 2018-03-26 MED ORDER — DEXMEDETOMIDINE HCL IN NACL 200 MCG/50ML IV SOLN
INTRAVENOUS | Status: AC
  Filled 2018-03-26: qty 50

## 2018-03-26 MED ORDER — LACTATED RINGERS IV SOLN
INTRAVENOUS | Status: DC
  Administered 2018-03-26 (×2): via INTRAVENOUS

## 2018-03-26 MED ORDER — SODIUM CHLORIDE 0.9 % IR SOLN
Status: DC | PRN
  Administered 2018-03-26: 1000 mL

## 2018-03-26 MED ORDER — TRANEXAMIC ACID-NACL 1000-0.7 MG/100ML-% IV SOLN
1000.0000 mg | INTRAVENOUS | Status: AC
  Administered 2018-03-26: 1000 mg via INTRAVENOUS
  Filled 2018-03-26: qty 100

## 2018-03-26 MED ORDER — ACETAMINOPHEN 10 MG/ML IV SOLN: 1000.0000 mg | Freq: Once | INTRAVENOUS | Status: DC | PRN

## 2018-03-26 MED ORDER — RIVAROXABAN 10 MG PO TABS
10.0000 mg | ORAL_TABLET | Freq: Every day | ORAL | Status: DC
  Administered 2018-03-27: 10 mg via ORAL
  Filled 2018-03-26: qty 1

## 2018-03-26 MED ORDER — ONDANSETRON HCL 4 MG PO TABS: 4.0000 mg | ORAL_TABLET | Freq: Four times a day (QID) | ORAL | Status: DC | PRN

## 2018-03-26 MED ORDER — FLEET ENEMA 7-19 GM/118ML RE ENEM: 1.0000 | ENEMA | Freq: Once | RECTAL | Status: DC | PRN

## 2018-03-26 MED ORDER — PHENYLEPHRINE 40 MCG/ML (10ML) SYRINGE FOR IV PUSH (FOR BLOOD PRESSURE SUPPORT)
PREFILLED_SYRINGE | INTRAVENOUS | Status: AC
  Filled 2018-03-26: qty 10

## 2018-03-26 MED ORDER — METOCLOPRAMIDE HCL 5 MG PO TABS: 5.0000 mg | ORAL_TABLET | Freq: Three times a day (TID) | ORAL | Status: DC | PRN

## 2018-03-26 MED ORDER — PROPOFOL 500 MG/50ML IV EMUL
INTRAVENOUS | Status: DC | PRN
  Administered 2018-03-26: 50 ug/kg/min via INTRAVENOUS

## 2018-03-26 MED ORDER — TRAMADOL HCL 50 MG PO TABS
50.0000 mg | ORAL_TABLET | Freq: Four times a day (QID) | ORAL | Status: DC | PRN
  Administered 2018-03-27: 100 mg via ORAL
  Filled 2018-03-26: qty 2

## 2018-03-26 MED ORDER — CHLORHEXIDINE GLUCONATE 4 % EX LIQD: 60.0000 mL | Freq: Once | CUTANEOUS | Status: DC

## 2018-03-26 MED ORDER — PHENYLEPHRINE 40 MCG/ML (10ML) SYRINGE FOR IV PUSH (FOR BLOOD PRESSURE SUPPORT)
PREFILLED_SYRINGE | INTRAVENOUS | Status: DC | PRN
  Administered 2018-03-26 (×2): 80 ug via INTRAVENOUS

## 2018-03-26 MED ORDER — ONDANSETRON HCL 4 MG/2ML IJ SOLN
INTRAMUSCULAR | Status: DC | PRN
  Administered 2018-03-26: 4 mg via INTRAVENOUS

## 2018-03-26 MED ORDER — DIPHENHYDRAMINE HCL 12.5 MG/5ML PO ELIX: 12.5000 mg | ORAL_SOLUTION | ORAL | Status: DC | PRN

## 2018-03-26 MED ORDER — METHOCARBAMOL 500 MG IVPB - SIMPLE MED
500.0000 mg | Freq: Four times a day (QID) | INTRAVENOUS | Status: DC | PRN
  Filled 2018-03-26: qty 50

## 2018-03-26 MED ORDER — DEXAMETHASONE SODIUM PHOSPHATE 10 MG/ML IJ SOLN
INTRAMUSCULAR | Status: AC
  Filled 2018-03-26: qty 1

## 2018-03-26 MED ORDER — CEFAZOLIN SODIUM-DEXTROSE 2-4 GM/100ML-% IV SOLN
2.0000 g | INTRAVENOUS | Status: AC
  Administered 2018-03-26: 2 g via INTRAVENOUS
  Filled 2018-03-26: qty 100

## 2018-03-26 MED ORDER — FENTANYL CITRATE (PF) 100 MCG/2ML IJ SOLN
INTRAMUSCULAR | Status: DC | PRN
  Administered 2018-03-26 (×2): 25 ug via INTRAVENOUS

## 2018-03-26 MED ORDER — DEXAMETHASONE SODIUM PHOSPHATE 10 MG/ML IJ SOLN
10.0000 mg | Freq: Once | INTRAMUSCULAR | Status: AC
  Administered 2018-03-27: 10 mg via INTRAVENOUS
  Filled 2018-03-26: qty 1

## 2018-03-26 MED ORDER — OXYCODONE HCL 5 MG/5ML PO SOLN
5.0000 mg | Freq: Once | ORAL | Status: DC | PRN
  Filled 2018-03-26: qty 5

## 2018-03-26 MED ORDER — ONDANSETRON HCL 4 MG/2ML IJ SOLN
INTRAMUSCULAR | Status: AC
  Filled 2018-03-26: qty 2

## 2018-03-26 MED ORDER — ACETAMINOPHEN 10 MG/ML IV SOLN
1000.0000 mg | Freq: Four times a day (QID) | INTRAVENOUS | Status: DC
  Administered 2018-03-26: 1000 mg via INTRAVENOUS
  Filled 2018-03-26: qty 100

## 2018-03-26 MED ORDER — METOCLOPRAMIDE HCL 5 MG/ML IJ SOLN: 5.0000 mg | Freq: Three times a day (TID) | INTRAMUSCULAR | Status: DC | PRN

## 2018-03-26 SURGICAL SUPPLY — 45 items
BAG DECANTER FOR FLEXI CONT (MISCELLANEOUS) ×3 IMPLANT
BAG SPEC THK2 15X12 ZIP CLS (MISCELLANEOUS)
BAG ZIPLOCK 12X15 (MISCELLANEOUS) IMPLANT
BLADE SAG 18X100X1.27 (BLADE) ×3 IMPLANT
BLADE SURG SZ10 CARB STEEL (BLADE) ×6 IMPLANT
CLOSURE STERI-STRIP 1/2X4 (GAUZE/BANDAGES/DRESSINGS) ×1
CLOSURE WOUND 1/2 X4 (GAUZE/BANDAGES/DRESSINGS) ×1
CLSR STERI-STRIP ANTIMIC 1/2X4 (GAUZE/BANDAGES/DRESSINGS) ×1 IMPLANT
COVER PERINEAL POST (MISCELLANEOUS) ×3 IMPLANT
COVER SURGICAL LIGHT HANDLE (MISCELLANEOUS) ×3 IMPLANT
COVER WAND RF STERILE (DRAPES) IMPLANT
CUP ACETBLR 48 OD SECTOR II (Hips) ×2 IMPLANT
DECANTER SPIKE VIAL GLASS SM (MISCELLANEOUS) ×3 IMPLANT
DRAPE STERI IOBAN 125X83 (DRAPES) ×3 IMPLANT
DRAPE U-SHAPE 47X51 STRL (DRAPES) ×6 IMPLANT
DRSG ADAPTIC 3X8 NADH LF (GAUZE/BANDAGES/DRESSINGS) ×3 IMPLANT
DRSG MEPILEX BORDER 4X4 (GAUZE/BANDAGES/DRESSINGS) ×3 IMPLANT
DRSG MEPILEX BORDER 4X8 (GAUZE/BANDAGES/DRESSINGS) ×3 IMPLANT
DURAPREP 26ML APPLICATOR (WOUND CARE) ×3 IMPLANT
ELECT REM PT RETURN 15FT ADLT (MISCELLANEOUS) ×3 IMPLANT
EVACUATOR 1/8 PVC DRAIN (DRAIN) ×3 IMPLANT
GLOVE BIO SURGEON STRL SZ7 (GLOVE) ×3 IMPLANT
GLOVE BIO SURGEON STRL SZ8 (GLOVE) ×3 IMPLANT
GLOVE BIOGEL PI IND STRL 7.0 (GLOVE) ×1 IMPLANT
GLOVE BIOGEL PI IND STRL 8 (GLOVE) ×1 IMPLANT
GLOVE BIOGEL PI INDICATOR 7.0 (GLOVE) ×2
GLOVE BIOGEL PI INDICATOR 8 (GLOVE) ×2
GOWN STRL REUS W/TWL LRG LVL3 (GOWN DISPOSABLE) ×3 IMPLANT
GOWN STRL REUS W/TWL XL LVL3 (GOWN DISPOSABLE) ×3 IMPLANT
HEAD CERAMIC DELTA 28M 12/14P5 (Head) ×2 IMPLANT
HOLDER FOLEY CATH W/STRAP (MISCELLANEOUS) ×3 IMPLANT
LINER MARATHON 28 48 (Hips) ×2 IMPLANT
MANIFOLD NEPTUNE II (INSTRUMENTS) ×3 IMPLANT
PACK ANTERIOR HIP CUSTOM (KITS) ×3 IMPLANT
STEM FEM SZ3 STD ACTIS (Stem) ×2 IMPLANT
STRIP CLOSURE SKIN 1/2X4 (GAUZE/BANDAGES/DRESSINGS) ×2 IMPLANT
SUT ETHIBOND NAB CT1 #1 30IN (SUTURE) ×3 IMPLANT
SUT MNCRL AB 4-0 PS2 18 (SUTURE) ×3 IMPLANT
SUT STRATAFIX 0 PDS 27 VIOLET (SUTURE) ×3
SUT VIC AB 2-0 CT1 27 (SUTURE) ×6
SUT VIC AB 2-0 CT1 TAPERPNT 27 (SUTURE) ×2 IMPLANT
SUTURE STRATFX 0 PDS 27 VIOLET (SUTURE) ×1 IMPLANT
SYR 50ML LL SCALE MARK (SYRINGE) IMPLANT
TRAY FOLEY MTR SLVR 16FR STAT (SET/KITS/TRAYS/PACK) ×3 IMPLANT
YANKAUER SUCT BULB TIP 10FT TU (MISCELLANEOUS) ×3 IMPLANT

## 2018-03-26 NOTE — Anesthesia Procedure Notes (Signed)
Procedure Name: MAC Date/Time: 03/26/2018 3:34 PM Performed by: West Pugh, CRNA Pre-anesthesia Checklist: Patient identified, Emergency Drugs available, Suction available, Patient being monitored and Timeout performed Patient Re-evaluated:Patient Re-evaluated prior to induction Oxygen Delivery Method: Simple face mask Preoxygenation: Pre-oxygenation with 100% oxygen Induction Type: IV induction Placement Confirmation: positive ETCO2 Dental Injury: Teeth and Oropharynx as per pre-operative assessment

## 2018-03-26 NOTE — Transfer of Care (Signed)
Immediate Anesthesia Transfer of Care Note  Patient: Regina [name]  Procedure(s) Performed: LEFT TOTAL HIP ARTHROPLASTY ANTERIOR APPROACH (Left Hip)  Patient Location: PACU  Anesthesia Type:MAC and Spinal  Level of Consciousness: awake, alert , oriented and patient cooperative  Airway & Oxygen Therapy: Patient Spontanous Breathing and Patient connected to nasal cannula oxygen  Post-op Assessment: Report given to RN and Post -op Vital signs reviewed and stable  Post vital signs: Reviewed and stable  Last Vitals:  Vitals Value Taken Time  BP 94/54 03/26/2018  5:31 PM  Temp    Pulse 80 03/26/2018  5:36 PM  Resp 17 03/26/2018  5:36 PM  SpO2 100 % 03/26/2018  5:36 PM  Vitals shown include unvalidated device data.  Last Pain:  Vitals:   03/26/18 1430  TempSrc: Oral         Complications: No apparent anesthesia complications

## 2018-03-26 NOTE — Plan of Care (Signed)

## 2018-03-26 NOTE — Anesthesia Preprocedure Evaluation (Addendum)
Anesthesia Evaluation  Patient identified by MRN, date of birth, ID band Patient awake    Reviewed: Allergy & Precautions, NPO status , Patient's Chart, lab work & pertinent test results  History of Anesthesia Complications (+) PONV and history of anesthetic complications  Airway Mallampati: II  TM Distance: >3 FB Neck ROM: Full    Dental  (+) Teeth Intact   Pulmonary neg pulmonary ROS,    breath sounds clear to auscultation       Cardiovascular negative cardio ROS   Rhythm:Regular     Neuro/Psych negative neurological ROS  negative psych ROS   GI/Hepatic negative GI ROS, Neg liver ROS,   Endo/Other  negative endocrine ROS  Renal/GU negative Renal ROS     Musculoskeletal  (+) Arthritis ,   Abdominal   Peds  Hematology negative hematology ROS (+)   Anesthesia Other Findings   Reproductive/Obstetrics                            Anesthesia Physical Anesthesia Plan  ASA: I  Anesthesia Plan: MAC and Spinal   Post-op Pain Management:    Induction:   PONV Risk Score and Plan: 3 and Treatment may vary due to age or medical condition and Propofol infusion  Airway Management Planned: Nasal Cannula  Additional Equipment: None  Intra-op Plan:   Post-operative Plan:   Informed Consent: I have reviewed the patients History and Physical, chart, labs and discussed the procedure including the risks, benefits and alternatives for the proposed anesthesia with the patient or authorized representative who has indicated his/her understanding and acceptance.   Dental advisory given  Plan Discussed with: CRNA and Surgeon  Anesthesia Plan Comments:         Anesthesia Quick Evaluation

## 2018-03-26 NOTE — Anesthesia Procedure Notes (Signed)
Spinal  Patient location during procedure: OR Start time: 03/26/2018 3:34 PM End time: 03/26/2018 3:42 PM Reason for block: at surgeon's request Staffing Anesthesiologist: Oleta Mouse, MD Resident/CRNA: West Pugh, CRNA Performed: resident/CRNA  Preanesthetic Checklist Completed: patient identified, site marked, surgical consent, pre-op evaluation, timeout performed, IV checked, risks and benefits discussed and monitors and equipment checked Spinal Block Patient position: sitting Prep: ChloraPrep Patient monitoring: heart rate, continuous pulse ox and blood pressure Approach: midline Location: L3-4 Injection technique: single-shot Needle Needle type: Pencan  Needle gauge: 24 G Needle length: 9 cm Assessment Sensory level: T6 Additional Notes Expiration of kit checked and confirmed. Patient tolerated procedure well,without complications with noted clear CSF. Loss of motor and sensory on exam post injection.

## 2018-03-26 NOTE — Interval H&P Note (Signed)
History and Physical Interval Note:  03/26/2018 2:28 PM  Regina [name]  has presented today for surgery, with the diagnosis of left hip osteoarthritis  The various methods of treatment have been discussed with the patient and family. After consideration of risks, benefits and other options for treatment, the patient has consented to  Procedure(s) with comments: LEFT Gaffney (Left) - 172min as a surgical intervention .  The patient's history has been reviewed, patient examined, no change in status, stable for surgery.  I have reviewed the patient's chart and labs.  Questions were answered to the patient's satisfaction.     Pilar Plate Karee [place]

## 2018-03-26 NOTE — Care Plan (Signed)
Ortho Bundle Case Management Note  Patient Details  Name: Regina [name] MRN: [id number] Date of Birth: [birth year]  L THA scheduled 03-26-18 DCP:  Home with spouse.  Lives in a 1 story home with 4 ste. DME:  No needs.  Has a RW and 3-in-1. PT:  HEP                   DME Arranged:  N/A DME Agency:  NA  HH Arranged:  NA HH Agency:  NA  Additional Comments: Please contact me with any questions of if this plan should need to change.  Marianne Sofia, RN,CCM [facility]  [phone] 03/26/2018, 4:20 PM

## 2018-03-26 NOTE — Anesthesia Postprocedure Evaluation (Signed)
Anesthesia Post Note  Patient: Regina [name]  Procedure(s) Performed: LEFT TOTAL HIP ARTHROPLASTY ANTERIOR APPROACH (Left Hip)     Patient location during evaluation: PACU Anesthesia Type: Spinal Level of consciousness: oriented and awake and alert Pain management: pain level controlled Vital Signs Assessment: post-procedure vital signs reviewed and stable Respiratory status: spontaneous breathing, respiratory function stable and patient connected to nasal cannula oxygen Cardiovascular status: blood pressure returned to baseline and stable Postop Assessment: no headache, no backache and no apparent nausea or vomiting Anesthetic complications: no    Last Vitals:  Vitals:   03/26/18 1815 03/26/18 1831  BP: (!) 105/46 (!) 116/57  Pulse: 60 63  Resp: 11 14  Temp: 36.9 C 36.9 C  SpO2: 100% 100%    Last Pain:  Vitals:   03/26/18 1831  TempSrc: Oral  PainSc:                  Addalie Calles S

## 2018-03-26 NOTE — Op Note (Signed)
OPERATIVE REPORT- TOTAL HIP ARTHROPLASTY   PREOPERATIVE DIAGNOSIS: Osteoarthritis of the Left hip.   POSTOPERATIVE DIAGNOSIS: Osteoarthritis of the Left  hip.   PROCEDURE: Left total hip arthroplasty, anterior approach.   SURGEON: Gaynelle Arabian, MD   ASSISTANT: Griffith Citron, PA-C  ANESTHESIA:  Spinal  ESTIMATED BLOOD LOSS:-250 mL    DRAINS: Hemovac x1.   COMPLICATIONS: None   CONDITION: PACU - hemodynamically stable.   BRIEF CLINICAL NOTE: Regina [name] is a 72 y.o. female who has advanced end-  stage arthritis of their Left  hip with progressively worsening pain and  dysfunction.The patient has failed nonoperative management and presents for  total hip arthroplasty.   PROCEDURE IN DETAIL: After successful administration of spinal  anesthetic, the traction boots for the [facility] bed were placed on both  feet and the patient was placed onto the [facility] bed, boots placed into the leg  holders. The Left hip was then isolated from the perineum with plastic  drapes and prepped and draped in the usual sterile fashion. ASIS and  greater trochanter were marked and a oblique incision was made, starting  at about 1 cm lateral and 2 cm distal to the ASIS and coursing towards  the anterior cortex of the femur. The skin was cut with a 10 blade  through subcutaneous tissue to the level of the fascia overlying the  tensor fascia lata muscle. The fascia was then incised in line with the  incision at the junction of the anterior third and posterior 2/3rd. The  muscle was teased off the fascia and then the interval between the TFL  and the rectus was developed. The Hohmann retractor was then placed at  the top of the femoral neck over the capsule. The vessels overlying the  capsule were cauterized and the fat on top of the capsule was removed.  A Hohmann retractor was then placed anterior underneath the rectus  femoris to give exposure to the entire anterior capsule. A T-shaped   capsulotomy was performed. The edges were tagged and the femoral head  was identified.       Osteophytes are removed off the superior acetabulum.  The femoral neck was then cut in situ with an oscillating saw. Traction  was then applied to the left lower extremity utilizing the [facility]  traction. The femoral head was then removed. Retractors were placed  around the acetabulum and then circumferential removal of the labrum was  performed. Osteophytes were also removed. Reaming starts at 45 mm to  medialize and  Increased in 2 mm increments to 47 mm. We reamed in  approximately 40 degrees of abduction, 20 degrees anteversion. A 48 mm  pinnacle acetabular shell was then impacted in anatomic position under  fluoroscopic guidance with excellent purchase. We did not need to place  any additional dome screws. A 28 mm neytral + 4 marathon liner was then  placed into the acetabular shell.       The femoral lift was then placed along the lateral aspect of the femur  just distal to the vastus ridge. The leg was  externally rotated and capsule  was stripped off the inferior aspect of the femoral neck down to the  level of the lesser trochanter, this was done with electrocautery. The femur was lifted after this was performed. The  leg was then placed in an extended and adducted position essentially delivering the femur. We also removed the capsule superiorly and the piriformis from the piriformis  fossa to gain excellent exposure of the  proximal femur. Rongeur was used to remove some cancellous bone to get  into the lateral portion of the proximal femur for placement of the  initial starter reamer. The starter broaches was placed  the starter broach  and was shown to go down the center of the canal. Broaching  with the Actis system was then performed starting at size 0  coursing  Up to size 3. A size 3 had excellent torsional and rotational  and axial stability. The trial standard offset neck was then  placed  with a 28 + 5 trial head. The hip was then reduced. We confirmed that  the stem was in the canal both on AP and lateral x-rays. It also has excellent sizing. The hip was reduced with outstanding stability through full extension and full external rotation.. AP pelvis was taken and the leg lengths were measured and found to be equal. Hip was then dislocated again and the femoral head and neck removed. The  femoral broach was removed. Size 3 Actis stem with a standard offset  neck was then impacted into the femur following native anteversion. Has  excellent purchase in the canal. Excellent torsional and rotational and  axial stability. It is confirmed to be in the canal on AP and lateral  fluoroscopic views. The 28 + 5 ceramic head was placed and the hip  reduced with outstanding stability. Again AP pelvis was taken and it  confirmed that the leg lengths were equal. The wound was then copiously  irrigated with saline solution and the capsule reattached and repaired  with Ethibond suture. 30 ml of .25% Bupivicaine was  injected into the capsule and into the edge of the tensor fascia lata as well as subcutaneous tissue. The fascia overlying the tensor fascia lata was then closed with a running #1 V-Loc. Subcu was closed with interrupted 2-0 Vicryl and subcuticular running 4-0 Monocryl. Incision was cleaned  and dried. Steri-Strips and a bulky sterile dressing applied. Hemovac  drain was hooked to suction and then the patient was awakened and transported to  recovery in stable condition.        Please note that a surgical assistant was a medical necessity for this procedure to perform it in a safe and expeditious manner. Assistant was necessary to provide appropriate retraction of vital neurovascular structures and to prevent femoral fracture and allow for anatomic placement of the prosthesis.  Gaynelle Arabian, M.D.

## 2018-03-26 NOTE — Discharge Instructions (Addendum)
°Dr. Frank Aluisio °Total Joint Specialist °[facility] °[address] °[place], [place] [address] °[phone] ° °ANTERIOR APPROACH TOTAL HIP REPLACEMENT POSTOPERATIVE DIRECTIONS ° ° °Hip Rehabilitation, Guidelines Following Surgery  °The results of a hip operation are greatly improved after range of motion and muscle strengthening exercises. Follow all safety measures which are given to protect your hip. If any of these exercises cause increased pain or swelling in your joint, decrease the amount until you are comfortable again. Then slowly increase the exercises. Call your caregiver if you have problems or questions.  ° °HOME CARE INSTRUCTIONS  °• Remove items at home which could result in a fall. This includes throw rugs or furniture in walking pathways.  °· ICE to the affected hip every three hours for 30 minutes at a time and then as needed for pain and swelling.  Continue to use ice on the hip for pain and swelling from surgery. You may notice swelling that will progress down to the foot and ankle.  This is normal after surgery.  Elevate the leg when you are not up walking on it.   °· Continue to use the breathing machine which will help keep your temperature down.  It is common for your temperature to cycle up and down following surgery, especially at night when you are not up moving around and exerting yourself.  The breathing machine keeps your lungs expanded and your temperature down. ° °DIET °You may resume your previous home diet once your are discharged from the hospital. ° °DRESSING / WOUND CARE / SHOWERING °You may shower 3 days after surgery, but keep the wounds dry during showering.  You may use an occlusive plastic wrap (Press'n Seal for example), NO SOAKING/SUBMERGING IN THE BATHTUB.  If the bandage gets wet, change with a clean dry gauze.  If the incision gets wet, pat the wound dry with a clean towel. °You may start showering once you are discharged home but do not submerge the  incision under water. Just pat the incision dry and apply a dry gauze dressing on daily. °Change the surgical dressing daily and reapply a dry dressing each time. ° °ACTIVITY °Walk with your walker as instructed. °Use walker as long as suggested by your caregivers. °Avoid periods of inactivity such as sitting longer than an hour when not asleep. This helps prevent blood clots.  °You may resume a sexual relationship in one month or when given the OK by your doctor.  °You may return to work once you are cleared by your doctor.  °Do not drive a car for 6 weeks or until released by you surgeon.  °Do not drive while taking narcotics. ° °WEIGHT BEARING °Weight bearing as tolerated with assist device (walker, cane, etc) as directed, use it as long as suggested by your surgeon or therapist, typically at least 4-6 weeks. ° °POSTOPERATIVE CONSTIPATION PROTOCOL °Constipation - defined medically as fewer than three stools per week and severe constipation as less than one stool per week. ° °One of the most common issues patients have following surgery is constipation.  Even if you have a regular bowel pattern at home, your normal regimen is likely to be disrupted due to multiple reasons following surgery.  Combination of anesthesia, postoperative narcotics, change in appetite and fluid intake all can affect your bowels.  In order to avoid complications following surgery, here are some recommendations in order to help you during your recovery period. ° °Colace (docusate) - Pick up an over-the-counter form   of Colace or another stool softener and take twice a day as long as you are requiring postoperative pain medications.  Take with a full glass of water daily.  If you experience loose stools or diarrhea, hold the colace until you stool forms back up.  If your symptoms do not get better within 1 week or if they get worse, check with your doctor. ° °Dulcolax (bisacodyl) - Pick up over-the-counter and take as directed by the product  packaging as needed to assist with the movement of your bowels.  Take with a full glass of water.  Use this product as needed if not relieved by Colace only.  ° °MiraLax (polyethylene glycol) - Pick up over-the-counter to have on hand.  MiraLax is a solution that will increase the amount of water in your bowels to assist with bowel movements.  Take as directed and can mix with a glass of water, juice, soda, coffee, or tea.  Take if you go more than two days without a movement. °Do not use MiraLax more than once per day. Call your doctor if you are still constipated or irregular after using this medication for 7 days in a row. ° °If you continue to have problems with postoperative constipation, please contact the office for further assistance and recommendations.  If you experience "the worst abdominal pain ever" or develop nausea or vomiting, please contact the office immediatly for further recommendations for treatment. ° °ITCHING ° If you experience itching with your medications, try taking only a single pain pill, or even half a pain pill at a time.  You can also use Benadryl over the counter for itching or also to help with sleep.  ° °TED HOSE STOCKINGS °Wear the elastic stockings on both legs for three weeks following surgery during the day but you may remove then at night for sleeping. ° °MEDICATIONS °See your medication summary on the “After Visit Summary” that the nursing staff will review with you prior to discharge.  You may have some home medications which will be placed on hold until you complete the course of blood thinner medication.  It is important for you to complete the blood thinner medication as prescribed by your surgeon.  Continue your approved medications as instructed at time of discharge. ° °PRECAUTIONS °If you experience chest pain or shortness of breath - call 911 immediately for transfer to the hospital emergency department.  °If you develop a fever greater that 101 F, purulent drainage  from wound, increased redness or drainage from wound, foul odor from the wound/dressing, or calf pain - CONTACT YOUR SURGEON.   °                                                °FOLLOW-UP APPOINTMENTS °Make sure you keep all of your appointments after your operation with your surgeon and caregivers. You should call the office at the above phone number and make an appointment for approximately two weeks after the date of your surgery or on the date instructed by your surgeon outlined in the "After Visit Summary". ° °RANGE OF MOTION AND STRENGTHENING EXERCISES  °These exercises are designed to help you keep full movement of your hip joint. Follow your caregiver's or physical therapist's instructions. Perform all exercises about fifteen times, three times per day or as directed. Exercise both hips, even if you have   had only one joint replacement. These exercises can be done on a training (exercise) mat, on the floor, on a table or on a bed. Use whatever works the best and is most comfortable for you. Use music or television while you are exercising so that the exercises are a pleasant break in your day. This will make your life better with the exercises acting as a break in routine you can look forward to.   Lying on your back, slowly slide your foot toward your buttocks, raising your knee up off the floor. Then slowly slide your foot back down until your leg is straight again.   Lying on your back spread your legs as far apart as you can without causing discomfort.   Lying on your side, raise your upper leg and foot straight up from the floor as far as is comfortable. Slowly lower the leg and repeat.   Lying on your back, tighten up the muscle in the front of your thigh (quadriceps muscles). You can do this by keeping your leg straight and trying to raise your heel off the floor. This helps strengthen the largest muscle supporting your knee.   Lying on your back, tighten up the muscles of your buttocks both  with the legs straight and with the knee bent at a comfortable angle while keeping your heel on the floor.   IF YOU ARE TRANSFERRED TO A SKILLED REHAB FACILITY If the patient is transferred to a skilled rehab facility following release from the hospital, a list of the current medications will be sent to the facility for the patient to continue.  When discharged from the skilled rehab facility, please have the facility set up the patient's [place] prior to being released. Also, the skilled facility will be responsible for providing the patient with their medications at time of release from the facility to include their pain medication, the muscle relaxants, and their blood thinner medication. If the patient is still at the rehab facility at time of the two week follow up appointment, the skilled rehab facility will also need to assist the patient in arranging follow up appointment in our office and any transportation needs.  MAKE SURE YOU:   Understand these instructions.   Get help right away if you are not doing well or get worse.    Pick up stool softner and laxative for home use following surgery while on pain medications. Do not submerge incision under water. Please use good hand washing techniques while changing dressing each day. May shower starting three days after surgery. Please use a clean towel to pat the incision dry following showers. Continue to use ice for pain and swelling after surgery. Do not use any lotions or creams on the incision until instructed by your surgeon.  Information on my medicine - XARELTO (Rivaroxaban)  Why was Xarelto prescribed for you? Xarelto was prescribed for you to reduce the risk of blood clots forming after orthopedic surgery. The medical term for these abnormal blood clots is venous thromboembolism (VTE).  What do you need to know about xarelto ? Take your Xarelto ONCE DAILY at the same time every day. You may take it  either with or without food.  If you have difficulty swallowing the tablet whole, you may crush it and mix in applesauce just prior to taking your dose.  Take Xarelto exactly as prescribed by your doctor and DO NOT stop taking Xarelto without talking to the doctor who prescribed the medication.  Stopping without other VTE prevention medication to take the place of Xarelto may increase your risk of developing a clot.  After discharge, you should have regular check-up appointments with your healthcare provider that is prescribing your Xarelto.    What do you do if you miss a dose? If you miss a dose, take it as soon as you remember on the same day then continue your regularly scheduled once daily regimen the next day. Do not take two doses of Xarelto on the same day.   Important Safety Information A possible side effect of Xarelto is bleeding. You should call your healthcare provider right away if you experience any of the following: ? Bleeding from an injury or your nose that does not stop. ? Unusual colored urine (red or dark brown) or unusual colored stools (red or black). ? Unusual bruising for unknown reasons. ? A serious fall or if you hit your head (even if there is no bleeding).  Some medicines may interact with Xarelto and might increase your risk of bleeding while on Xarelto. To help avoid this, consult your healthcare provider or pharmacist prior to using any new prescription or non-prescription medications, including herbals, vitamins, non-steroidal anti-inflammatory drugs (NSAIDs) and supplements.  This website has more information on Xarelto: [email or web address]

## 2018-03-27 LAB — CBC
HCT: 33.8 % — ABNORMAL LOW (ref 36.0–46.0)
Hemoglobin: 11.2 g/dL — ABNORMAL LOW (ref 12.0–15.0)
MCH: 32.6 pg (ref 26.0–34.0)
MCHC: 33.1 g/dL (ref 30.0–36.0)
MCV: 98.3 fL (ref 80.0–100.0)
PLATELETS: 209 10*3/uL (ref 150–400)
RBC: 3.44 MIL/uL — ABNORMAL LOW (ref 3.87–5.11)
RDW: 11.6 % (ref 11.5–15.5)
WBC: 11.5 10*3/uL — ABNORMAL HIGH (ref 4.0–10.5)
nRBC: 0 % (ref 0.0–0.2)

## 2018-03-27 LAB — BASIC METABOLIC PANEL
Anion gap: 8 (ref 5–15)
BUN: 8 mg/dL (ref 8–23)
CHLORIDE: 102 mmol/L (ref 98–111)
CO2: 27 mmol/L (ref 22–32)
CREATININE: 0.56 mg/dL (ref 0.44–1.00)
Calcium: 8.6 mg/dL — ABNORMAL LOW (ref 8.9–10.3)
GFR calc Af Amer: >60 mL/min (ref >60)
GFR calc non Af Amer: >60 mL/min (ref >60)
Glucose, Bld: 137 mg/dL — ABNORMAL HIGH (ref 70–99)
Potassium: 4.2 mmol/L (ref 3.5–5.1)
Sodium: 137 mmol/L (ref 135–145)

## 2018-03-27 MED ORDER — METHOCARBAMOL 500 MG PO TABS: 500.0000 mg | ORAL_TABLET | Freq: Four times a day (QID) | ORAL | 0 refills | Status: DC | PRN

## 2018-03-27 MED ORDER — TRAMADOL HCL 50 MG PO TABS: 50.0000 mg | ORAL_TABLET | Freq: Four times a day (QID) | ORAL | 0 refills | Status: DC | PRN

## 2018-03-27 MED ORDER — HYDROCODONE-ACETAMINOPHEN 5-325 MG PO TABS: 1.0000 | ORAL_TABLET | Freq: Four times a day (QID) | ORAL | 0 refills | Status: DC | PRN

## 2018-03-27 MED ORDER — RIVAROXABAN 10 MG PO TABS: 10.0000 mg | ORAL_TABLET | Freq: Every day | ORAL | 0 refills | Status: DC

## 2018-03-27 NOTE — Progress Notes (Signed)
Pt was provided with d/c instructions and prescritions. After discussing the pt's plan of care, the pt reported no further questions or concerns.

## 2018-03-27 NOTE — Care Management Note (Signed)
Case Management Note  Patient Details  Name: Regina [name] MRN: [id number] Date of Birth: 1946/02/28  Subjective/Objective:     Per PT, needs a RW.                Action/Plan: Contacted [facility] to deliver to the room,  Expected Discharge Date:  03/27/18               Expected Discharge Plan:  [place]  In-House Referral:  NA  Discharge planning Services  CM Consult  Post Acute Care Choice:  NA Choice offered to:  Patient  DME Arranged:  N/A DME Agency:  NA  HH Arranged:  NA HH Agency:  NA  Status of Service:  Completed, signed off  If discussed at [place] of Stay Meetings, dates discussed:    Additional Comments:  Guadalupe Maple, RN 03/27/2018, 12:44 PM

## 2018-03-27 NOTE — Evaluation (Addendum)
Physical Therapy Evaluation Patient Details Name: Regina [name] MRN: [id number] DOB: 08/22/1945 Today's Date: 03/27/2018   History of Present Illness  s/p L THA  Clinical Impression  Pt is s/p THA resulting in the deficits listed below (see PT Problem List).  Pt will benefit from skilled PT to increase their independence and safety with mobility to allow discharge to the venue listed below.      Follow Up Recommendations Follow surgeon's recommendation for DC plan and follow-up therapies    Equipment Recommendations  Rolling walker with 5" wheels    Recommendations for Other Services       Precautions / Restrictions Precautions Precautions: Fall Restrictions Weight Bearing Restrictions: No      Mobility  Bed Mobility               General bed mobility comments: in chair on arrival  Transfers Overall transfer level: Needs assistance Equipment used: Rolling walker (2 wheeled) Transfers: Sit to/from Stand Sit to Stand: Min guard         General transfer comment: cues for safety and hand placement  Ambulation/Gait Ambulation/Gait assistance: Min guard Gait Distance (Feet): 220 Feet Assistive device: Rolling walker (2 wheeled) Gait Pattern/deviations: Step-to pattern;Step-through pattern     General Gait Details: cues for sequence and RW safety  Stairs            Wheelchair Mobility    Modified Rankin (Stroke Patients Only)       Balance                                             Pertinent Vitals/Pain Pain Assessment: 0-10 Pain Score: 2  Pain Location: left hip Pain Descriptors / Indicators: Sore Pain Intervention(s): Premedicated before session;Limited activity within patient's tolerance;Monitored during session    Home Living Family/patient expects to be discharged to:: Private residence Living Arrangements: Spouse/significant other Available Help at Discharge: Family Type of Home: House Home Access: Stairs to  enter   Technical brewer of Steps: 4 Home Layout: One level Home Equipment: Environmental consultant - 4 wheels      Prior Function Level of Independence: Independent               Hand Dominance        Extremity/Trunk Assessment   Upper Extremity Assessment Upper Extremity Assessment: Defer to OT evaluation    Lower Extremity Assessment Lower Extremity Assessment: LLE deficits/detail LLE Deficits / Details: ankle WFL; knee and hip grossly 2+/5, limited by post op pain and weakness       Communication   Communication: No difficulties  Cognition Arousal/Alertness: Awake/alert Behavior During Therapy: WFL for tasks assessed/performed Overall Cognitive Status: Within Functional Limits for tasks assessed                                        General Comments      Exercises    Completed direct anterior THA exercises 1 set of 10-15 reps, using gait belt as leg lifter/assist for heel slides   Assessment/Plan    PT Assessment Patient needs continued PT services  PT Problem List Decreased activity tolerance;Decreased balance;Decreased mobility;Decreased knowledge of use of DME       PT Treatment Interventions Gait training;DME instruction;Functional mobility training;Therapeutic activities;Stair training;Therapeutic exercise  PT Goals (Current goals can be found in the Care Plan section)  Acute Rehab PT Goals PT Goal Formulation: With patient Time For Goal Achievement: 04/03/18 Potential to Achieve Goals: Good    Frequency 7X/week   Barriers to discharge        Co-evaluation               AM-PAC PT "6 Clicks" Mobility  Outcome Measure Help needed turning from your back to your side while in a flat bed without using bedrails?: A Little Help needed moving from lying on your back to sitting on the side of a flat bed without using bedrails?: A Little Help needed moving to and from a bed to a chair (including a wheelchair)?: A Little Help  needed standing up from a chair using your arms (e.g., wheelchair or bedside chair)?: A Little Help needed to walk in hospital room?: A Little Help needed climbing 3-5 steps with a railing? : A Little 6 Click Score: 18    End of Session Equipment Utilized During Treatment: Gait belt Activity Tolerance: Patient tolerated treatment well Patient left: in chair;with call bell/phone within reach;with family/visitor present   PT Visit Diagnosis: Difficulty in walking, not elsewhere classified (R26.2)    Time: 8592-9244 PT Time Calculation (min) (ACUTE ONLY): 26 min   Charges:   PT Evaluation $PT Eval Low Complexity: 1 Low PT Treatments $Gait Training: 8-22 mins        Kenyon Ana, PT  Pager: [phone] Acute Rehab Dept [facility]): [phone]   03/27/2018   [facility] 03/27/2018, 12:48 PM

## 2018-03-27 NOTE — Progress Notes (Signed)
   Subjective: 1 Day Post-Op Procedure(s) (LRB): LEFT TOTAL HIP ARTHROPLASTY ANTERIOR APPROACH (Left) Patient reports pain as mild.   Patient seen in rounds by Dr. Wynelle Link. Patient is well, and has had no acute complaints or problems other than discomfort in the left hip. Denies chest pain or SOB. Foley catheter removed this AM. States she is ready to go home. We will start therapy today.   Objective: Vital signs in last 24 hours: Temp:  [97.9 F (36.6 C)-99 F (37.2 C)] 98.6 F (37 C) (12/12 0621) Pulse Rate:  [59-85] 71 (12/12 0621) Resp:  [10-20] 14 (12/12 0621) BP: (94-157)/(46-71) 136/62 (12/12 0621) SpO2:  [99 %-100 %] 100 % (12/12 0621) Weight:  [56.9 kg] 56.9 kg (12/11 1430)  Intake/Output from previous day:  Intake/Output Summary (Last 24 hours) at 03/27/2018 0756 Last data filed at 03/27/2018 0620 Gross per 24 hour  Intake 2792.83 ml  Output 4095 ml  Net -1302.17 ml    Labs: Recent Labs    03/27/18 0422  HGB 11.2*   Recent Labs    03/27/18 0422  WBC 11.5*  RBC 3.44*  HCT 33.8*  PLT 209   Recent Labs    03/27/18 0422  NA 137  K 4.2  CL 102  CO2 27  BUN 8  CREATININE 0.56  GLUCOSE 137*  CALCIUM 8.6*   Exam: General - Patient is Alert and Oriented Extremity - Neurologically intact Neurovascular intact Sensation intact distally Dorsiflexion/Plantar flexion intact Dressing - dressing C/D/I Motor Function - intact, moving foot and toes well on exam.   Past Medical History:  Diagnosis Date  . Arthritis   . Complication of anesthesia   . PONV (postoperative nausea and vomiting)     Assessment/Plan: 1 Day Post-Op Procedure(s) (LRB): LEFT TOTAL HIP ARTHROPLASTY ANTERIOR APPROACH (Left) Principal Problem:   OA (osteoarthritis) of hip  Estimated body mass index is 23.33 kg/m as calculated from the following:   Height as of this encounter: 5' 1.5" (1.562 m).   Weight as of this encounter: 56.9 kg. Advance diet Up with therapy D/C IV  fluids  DVT Prophylaxis - Xarelto Weight bearing as tolerated. D/C O2 and pulse ox and try on room air. Hemovac pulled without difficulty, will begin therapy.  Plan is to go Home after hospital stay. Plan for discharge later today after two sessions of therapy with [facility]. Follow-up in the office in 2 weeks with Dr. Wynelle Link.  Theresa Duty, PA-C Orthopedic Surgery 03/27/2018, 7:56 AM

## 2018-03-27 NOTE — Progress Notes (Signed)
   03/27/18 1400  PT Visit Information  Last PT Received On 03/27/18 Pt is progressing well; had episode of nausea earlier but feeling better now and agreeable to therapy; reviewed gait/stairs, husband present for session; pt is ok to d/c from mobility standpoint-- if nausea/pain remain Controlled , defer to RN   Assistance Needed +1  History of Present Illness s/p L THA  Precautions  Precautions Fall  Pain Assessment  Pain Assessment 0-10  Pain Score 2  Pain Location left hip  Pain Descriptors / Indicators Sore  Pain Intervention(s) Monitored during session  Cognition  Arousal/Alertness Awake/alert  Behavior During Therapy [facility] for tasks assessed/performed  Overall Cognitive Status Within Functional Limits for tasks assessed  Bed Mobility  Overal bed mobility Needs Assistance  Bed Mobility Supine to Sit;Sit to Supine  Supine to sit Supervision  Sit to supine Supervision  General bed mobility comments incr time, cues for use of gait belt as leg lifter  Transfers  Overall transfer level Needs assistance  Equipment used Rolling walker (2 wheeled)  Transfers Sit to/from Stand  Sit to Stand Supervision  General transfer comment cues for safety and hand placement  Ambulation/Gait  Ambulation/Gait assistance Supervision;Min guard  Gait Distance (Feet) 230 Feet  Assistive device Rolling walker (2 wheeled)  Gait Pattern/deviations Step-to pattern;Step-through pattern  General Gait Details cues for sequence and RW safety  Stairs Yes  Stairs assistance Min guard  Stair Management One rail Right;One rail Left;Step to pattern;Forwards;Backwards;With walker  Number of Stairs 4  General stair comments up/down with one rail, up/down with RW backwards  PT - End of Session  Equipment Utilized During Treatment Gait belt  Activity Tolerance Patient tolerated treatment well  Patient left with call bell/phone within reach;in bed;with family/visitor present   PT - Assessment/Plan  PT Plan  Current plan remains appropriate  PT Visit Diagnosis Difficulty in walking, not elsewhere classified (R26.2)  PT Frequency (ACUTE ONLY) 7X/week  Follow Up Recommendations Follow surgeon's recommendation for DC plan and follow-up therapies  PT equipment Rolling walker with 5" wheels  AM-PAC PT "6 Clicks" Mobility Outcome Measure (Version 2)  Help needed turning from your back to your side while in a flat bed without using bedrails? 3  Help needed moving from lying on your back to sitting on the side of a flat bed without using bedrails? 3  Help needed moving to and from a bed to a chair (including a wheelchair)? 3  Help needed standing up from a chair using your arms (e.g., wheelchair or bedside chair)? 3  Help needed to walk in hospital room? 3  Help needed climbing 3-5 steps with a railing?  3  6 Click Score 18  Consider Recommendation of Discharge To: Home with [facility]  PT Goal Progression  Progress towards PT goals Progressing toward goals  Acute Rehab PT Goals  PT Goal Formulation With patient  Time For Goal Achievement 04/03/18  Potential to Achieve Goals Good  PT Time Calculation  PT Start Time (ACUTE ONLY) 1430  PT Stop Time (ACUTE ONLY) 1457  PT Time Calculation (min) (ACUTE ONLY) 27 min  PT General Charges  $$ ACUTE PT VISIT 1 Visit  PT Treatments  $Gait Training 23-37 mins

## 2018-03-28 ENCOUNTER — Encounter (HOSPITAL_COMMUNITY): Payer: Self-pay | Admitting: Orthopedic Surgery

## 2018-03-31 NOTE — Discharge Summary (Signed)
Physician Discharge Summary   Patient ID: Regina [name] MRN: [id number] DOB/AGE: [birth year] 72 y.o.  Admit date: 03/26/2018 Discharge date: 03/27/2018  Primary Diagnosis: Osteoarthritis, left hip   Admission Diagnoses:  Past Medical History:  Diagnosis Date  . Arthritis   . Complication of anesthesia   . PONV (postoperative nausea and vomiting)    Discharge Diagnoses:   Principal Problem:   OA (osteoarthritis) of hip  Estimated body mass index is 23.33 kg/m as calculated from the following:   Height as of this encounter: 5' 1.5" (1.562 m).   Weight as of this encounter: 56.9 kg.  Procedure:  Procedure(s) (LRB): LEFT TOTAL HIP ARTHROPLASTY ANTERIOR APPROACH (Left)   Consults: None  HPI: Regina [name] is a 72 y.o. female who has advanced end-stage arthritis of their Left  hip with progressively worsening pain and dysfunction.The patient has failed nonoperative management and presents for total hip arthroplasty.   Laboratory Data: Admission on 03/26/2018, Discharged on 03/27/2018  Component Date Value Ref Range Status  . WBC 03/27/2018 11.5* 4.0 - 10.5 K/uL Final  . RBC 03/27/2018 3.44* 3.87 - 5.11 MIL/uL Final  . Hemoglobin 03/27/2018 11.2* 12.0 - 15.0 g/dL Final  . HCT 03/27/2018 33.8* 36.0 - 46.0 % Final  . MCV 03/27/2018 98.3  80.0 - 100.0 fL Final  . MCH 03/27/2018 32.6  26.0 - 34.0 pg Final  . MCHC 03/27/2018 33.1  30.0 - 36.0 g/dL Final  . RDW 03/27/2018 11.6  11.5 - 15.5 % Final  . Platelets 03/27/2018 209  150 - 400 K/uL Final  . nRBC 03/27/2018 0.0  0.0 - 0.2 % Final   Performed at [facility], [place] [address], [place], [place] [address]  . Sodium 03/27/2018 137  135 - 145 mmol/L Final  . Potassium 03/27/2018 4.2  3.5 - 5.1 mmol/L Final  . Chloride 03/27/2018 102  98 - 111 mmol/L Final  . CO2 03/27/2018 27  22 - 32 mmol/L Final  . Glucose, Bld 03/27/2018 137* 70 - 99 mg/dL Final  . BUN 03/27/2018 8  8 - 23 mg/dL Final  . Creatinine,  Ser 03/27/2018 0.56  0.44 - 1.00 mg/dL Final  . Calcium 03/27/2018 8.6* 8.9 - 10.3 mg/dL Final  . GFR calc non Af Amer 03/27/2018 >60  >60 mL/min Final  . GFR calc Af Amer 03/27/2018 >60  >60 mL/min Final  . Anion gap 03/27/2018 8  5 - 15 Final   Performed at [facility], [place] [address], [place], [place] [address]  Hospital Outpatient Visit on 03/17/2018  Component Date Value Ref Range Status  . aPTT 03/17/2018 29  24 - 36 seconds Final   Performed at [facility], [place] [address], [place], [place] [address]  . WBC 03/17/2018 6.4  4.0 - 10.5 K/uL Final  . RBC 03/17/2018 3.89  3.87 - 5.11 MIL/uL Final  . Hemoglobin 03/17/2018 12.9  12.0 - 15.0 g/dL Final  . HCT 03/17/2018 39.3  36.0 - 46.0 % Final  . MCV 03/17/2018 101.0* 80.0 - 100.0 fL Final  . MCH 03/17/2018 33.2  26.0 - 34.0 pg Final  . MCHC 03/17/2018 32.8  30.0 - 36.0 g/dL Final  . RDW 03/17/2018 11.9  11.5 - 15.5 % Final  . Platelets 03/17/2018 281  150 - 400 K/uL Final  . nRBC 03/17/2018 0.0  0.0 - 0.2 % Final   Performed at [facility], [place] [address] [place], [place] [address]  .  Sodium 03/17/2018 140  135 - 145 mmol/L Final  . Potassium 03/17/2018 4.0  3.5 - 5.1 mmol/L Final  . Chloride 03/17/2018 104  98 - 111 mmol/L Final  . CO2 03/17/2018 29  22 - 32 mmol/L Final  . Glucose, Bld 03/17/2018 97  70 - 99 mg/dL Final  . BUN 03/17/2018 19  8 - 23 mg/dL Final  . Creatinine, Ser 03/17/2018 0.60  0.44 - 1.00 mg/dL Final  . Calcium 03/17/2018 9.2  8.9 - 10.3 mg/dL Final  . Total Protein 03/17/2018 6.8  6.5 - 8.1 g/dL Final  . Albumin 03/17/2018 4.2  3.5 - 5.0 g/dL Final  . AST 03/17/2018 19  15 - 41 U/L Final  . ALT 03/17/2018 14  0 - 44 U/L Final  . Alkaline Phosphatase 03/17/2018 62  38 - 126 U/L Final  . Total Bilirubin 03/17/2018 0.5  0.3 - 1.2 mg/dL Final  . GFR calc non Af Amer 03/17/2018 >60  >60 mL/min Final  . GFR calc Af Amer 03/17/2018 >60  >60 mL/min Final    . Anion gap 03/17/2018 7  5 - 15 Final   Performed at [facility], [place] [address] [place], [place] [address]  . Prothrombin Time 03/17/2018 12.1  11.4 - 15.2 seconds Final  . INR 03/17/2018 0.90   Final   Performed at [facility], [place] [address], [place], [place] [address]  . ABO/RH(D) 03/17/2018 A POS   Final  . Antibody Screen 03/17/2018 NEG   Final  . Sample Expiration 03/17/2018 03/29/2018   Final  . Extend sample reason 03/17/2018    Final                   Value:NO TRANSFUSIONS OR PREGNANCY IN THE PAST 3 MONTHS Performed at [facility], [place] [address], [place], [place] [address]   . MRSA, PCR 03/17/2018 NEGATIVE  NEGATIVE Final  . Staphylococcus aureus 03/17/2018 NEGATIVE  NEGATIVE Final   Comment: (NOTE) The Xpert SA Assay (FDA approved for NASAL specimens in patients 24 years of age and older), is one component of a comprehensive surveillance program. It is not intended to diagnose infection nor to guide or monitor treatment. Performed at [facility], [place] [address], [place],  [address]   . ABO/RH(D) 03/17/2018    Final                   Value:A POS Performed at [facility], [place] [address] [place],  [address]      X-Rays:Dg Pelvis Portable  Result Date: 03/26/2018 CLINICAL DATA:  Hip replacement EXAM: PORTABLE PELVIS 1-2 VIEWS COMPARISON:  03/14/2004 FINDINGS: There are bilateral hip replacements which demonstrate normal alignment. There is no fracture. The pubic symphysis is intact. Surgical drain over the left hip with small foci of soft tissue gas. IMPRESSION: Left hip replacement with expected postsurgical changes Electronically Signed   By: Donavan Foil M.D.   On: 03/26/2018 18:24   Dg C-arm 1-60 Min  Result Date: 03/26/2018 CLINICAL DATA:  Left hip replacement EXAM: DG C-ARM 61-120 MIN COMPARISON:  None. FINDINGS: Three low resolution intraoperative  spot views of the left hip. Total fluoroscopy time was 14 seconds. Evidence of prior right hip replacement. Status post left hip replacement with normal alignment. IMPRESSION: Intraoperative fluoroscopic assistance provided during left hip replacement Electronically Signed   By: Donavan Foil M.D.   On: 03/26/2018 18:24   Dg C-arm  1-60 Min-no Report  Result Date: 03/26/2018 Fluoroscopy was utilized by the requesting physician.  No radiographic interpretation.    EKG: Orders placed or performed in visit on 12/26/17  . EKG 12-Lead     Hospital Course: Regina [name] is a 73 y.o. who was admitted to [facility]. They were brought to the operating room on 03/26/2018 and underwent Procedure(s): LEFT TOTAL HIP ARTHROPLASTY ANTERIOR APPROACH.  Patient tolerated the procedure well and was later transferred to the recovery room and then to the orthopaedic floor for postoperative care. They were given PO and IV analgesics for pain control following their surgery. They were given 24 hours of postoperative antibiotics of  Anti-infectives (From admission, onward)   Start     Dose/Rate Route Frequency Ordered Stop   03/26/18 2200  ceFAZolin (ANCEF) IVPB 1 g/50 mL premix     1 g 100 mL/hr over 30 Minutes Intravenous Every 6 hours 03/26/18 1847 03/27/18 0523   03/26/18 1445  ceFAZolin (ANCEF) IVPB 2g/100 mL premix     2 g 200 mL/hr over 30 Minutes Intravenous On call to O.R. 03/26/18 1422 03/26/18 1613     and started on DVT prophylaxis in the form of Xarelto.   PT and OT were ordered for total joint protocol. Discharge planning consulted to help with postop disposition and equipment needs.  Patient had a good night on the evening of surgery. They started to get up OOB with therapy on POD #1. Pt was seen during rounds and was ready to go home pending progress with therapy. Hemovac drain was pulled without difficulty. She worked with therapy on POD #1 and was meeting her goals. Pt was discharged to  home later that day in stable condition.  Diet: Regular diet Activity: WBAT Follow-up: in 2 weeks with Dr. Wynelle Link Disposition: Home with HEP Discharged Condition: stable   Discharge Instructions    Call MD / Call 911   Complete by:  As directed    If you experience chest pain or shortness of breath, CALL 911 and be transported to the hospital emergency room.  If you develope a fever above 101 F, pus (white drainage) or increased drainage or redness at the wound, or calf pain, call your surgeon's office.   Change dressing   Complete by:  As directed    You may change your dressing on Friday, then change the dressing daily with sterile 4 x 4 inch gauze dressing and paper tape.   Constipation Prevention   Complete by:  As directed    Drink plenty of fluids.  Prune juice may be helpful.  You may use a stool softener, such as Colace (over the counter) 100 mg twice a day.  Use MiraLax (over the counter) for constipation as needed.   Diet - low sodium heart healthy   Complete by:  As directed    Discharge instructions   Complete by:  As directed    Dr. Gaynelle Arabian Total Joint Specialist [facility] [address], [place], [place] [address] [phone]  ANTERIOR APPROACH TOTAL HIP REPLACEMENT POSTOPERATIVE DIRECTIONS   Hip Rehabilitation, Guidelines Following Surgery  The results of a hip operation are greatly improved after range of motion and muscle strengthening exercises. Follow all safety measures which are given to protect your hip. If any of these exercises cause increased pain or swelling in your joint, decrease the amount until you are comfortable again. Then slowly increase the exercises. Call your caregiver if you have  problems or questions.   HOME CARE INSTRUCTIONS  Remove items at home which could result in a fall. This includes throw rugs or furniture in walking pathways.  ICE to the affected hip every three hours for 30 minutes at a time and then as  needed for pain and swelling.  Continue to use ice on the hip for pain and swelling from surgery. You may notice swelling that will progress down to the foot and ankle.  This is normal after surgery.  Elevate the leg when you are not up walking on it.   Continue to use the breathing machine which will help keep your temperature down.  It is common for your temperature to cycle up and down following surgery, especially at night when you are not up moving around and exerting yourself.  The breathing machine keeps your lungs expanded and your temperature down.  DIET You may resume your previous home diet once your are discharged from the hospital.  DRESSING / WOUND CARE / SHOWERING You may shower 3 days after surgery, but keep the wounds dry during showering.  You may use an occlusive plastic wrap (Press'n Seal for example), NO SOAKING/SUBMERGING IN THE BATHTUB.  If the bandage gets wet, change with a clean dry gauze.  If the incision gets wet, pat the wound dry with a clean towel. You may start showering once you are discharged home but do not submerge the incision under water. Just pat the incision dry and apply a dry gauze dressing on daily. Change the surgical dressing daily and reapply a dry dressing each time.  ACTIVITY Walk with your walker as instructed. Use walker as long as suggested by your caregivers. Avoid periods of inactivity such as sitting longer than an hour when not asleep. This helps prevent blood clots.  You may resume a sexual relationship in one month or when given the OK by your doctor.  You may return to work once you are cleared by your doctor.  Do not drive a car for 6 weeks or until released by you surgeon.  Do not drive while taking narcotics.  WEIGHT BEARING Weight bearing as tolerated with assist device (walker, cane, etc) as directed, use it as long as suggested by your surgeon or therapist, typically at least 4-6 weeks.  POSTOPERATIVE CONSTIPATION  PROTOCOL Constipation - defined medically as fewer than three stools per week and severe constipation as less than one stool per week.  One of the most common issues patients have following surgery is constipation.  Even if you have a regular bowel pattern at home, your normal regimen is likely to be disrupted due to multiple reasons following surgery.  Combination of anesthesia, postoperative narcotics, change in appetite and fluid intake all can affect your bowels.  In order to avoid complications following surgery, here are some recommendations in order to help you during your recovery period.  Colace (docusate) - Pick up an over-the-counter form of Colace or another stool softener and take twice a day as long as you are requiring postoperative pain medications.  Take with a full glass of water daily.  If you experience loose stools or diarrhea, hold the colace until you stool forms back up.  If your symptoms do not get better within 1 week or if they get worse, check with your doctor.  Dulcolax (bisacodyl) - Pick up over-the-counter and take as directed by the product packaging as needed to assist with the movement of your bowels.  Take with a full  glass of water.  Use this product as needed if not relieved by Colace only.   MiraLax (polyethylene glycol) - Pick up over-the-counter to have on hand.  MiraLax is a solution that will increase the amount of water in your bowels to assist with bowel movements.  Take as directed and can mix with a glass of water, juice, soda, coffee, or tea.  Take if you go more than two days without a movement. Do not use MiraLax more than once per day. Call your doctor if you are still constipated or irregular after using this medication for 7 days in a row.  If you continue to have problems with postoperative constipation, please contact the office for further assistance and recommendations.  If you experience "the worst abdominal pain ever" or develop nausea or  vomiting, please contact the office immediatly for further recommendations for treatment.  ITCHING  If you experience itching with your medications, try taking only a single pain pill, or even half a pain pill at a time.  You can also use Benadryl over the counter for itching or also to help with sleep.   TED HOSE STOCKINGS Wear the elastic stockings on both legs for three weeks following surgery during the day but you may remove then at night for sleeping.  MEDICATIONS See your medication summary on the "After Visit Summary" that the nursing staff will review with you prior to discharge.  You may have some home medications which will be placed on hold until you complete the course of blood thinner medication.  It is important for you to complete the blood thinner medication as prescribed by your surgeon.  Continue your approved medications as instructed at time of discharge.  PRECAUTIONS If you experience chest pain or shortness of breath - call 911 immediately for transfer to the hospital emergency department.  If you develop a fever greater that 101 F, purulent drainage from wound, increased redness or drainage from wound, foul odor from the wound/dressing, or calf pain - CONTACT YOUR SURGEON.                                                   FOLLOW-UP APPOINTMENTS Make sure you keep all of your appointments after your operation with your surgeon and caregivers. You should call the office at the above phone number and make an appointment for approximately two weeks after the date of your surgery or on the date instructed by your surgeon outlined in the "After Visit Summary".  RANGE OF MOTION AND STRENGTHENING EXERCISES  These exercises are designed to help you keep full movement of your hip joint. Follow your caregiver's or physical therapist's instructions. Perform all exercises about fifteen times, three times per day or as directed. Exercise both hips, even if you have had only one joint  replacement. These exercises can be done on a training (exercise) mat, on the floor, on a table or on a bed. Use whatever works the best and is most comfortable for you. Use music or television while you are exercising so that the exercises are a pleasant break in your day. This will make your life better with the exercises acting as a break in routine you can look forward to.  Lying on your back, slowly slide your foot toward your buttocks, raising your knee up off the floor. Then slowly  slide your foot back down until your leg is straight again.  Lying on your back spread your legs as far apart as you can without causing discomfort.  Lying on your side, raise your upper leg and foot straight up from the floor as far as is comfortable. Slowly lower the leg and repeat.  Lying on your back, tighten up the muscle in the front of your thigh (quadriceps muscles). You can do this by keeping your leg straight and trying to raise your heel off the floor. This helps strengthen the largest muscle supporting your knee.  Lying on your back, tighten up the muscles of your buttocks both with the legs straight and with the knee bent at a comfortable angle while keeping your heel on the floor.   IF YOU ARE TRANSFERRED TO A SKILLED REHAB FACILITY If the patient is transferred to a skilled rehab facility following release from the hospital, a list of the current medications will be sent to the facility for the patient to continue.  When discharged from the skilled rehab facility, please have the facility set up the patient's [place] prior to being released. Also, the skilled facility will be responsible for providing the patient with their medications at time of release from the facility to include their pain medication, the muscle relaxants, and their blood thinner medication. If the patient is still at the rehab facility at time of the two week follow up appointment, the skilled rehab facility will  also need to assist the patient in arranging follow up appointment in our office and any transportation needs.  MAKE SURE YOU:  Understand these instructions.  Get help right away if you are not doing well or get worse.    Pick up stool softner and laxative for home use following surgery while on pain medications. Do not submerge incision under water. Please use good hand washing techniques while changing dressing each day. May shower starting three days after surgery. Please use a clean towel to pat the incision dry following showers. Continue to use ice for pain and swelling after surgery. Do not use any lotions or creams on the incision until instructed by your surgeon.   Do not sit on low chairs, stoools or toilet seats, as it may be difficult to get up from low surfaces   Complete by:  As directed    Driving restrictions   Complete by:  As directed    No driving for two weeks   TED hose   Complete by:  As directed    Use stockings (TED hose) for three weeks on both leg(s).  You may remove them at night for sleeping.   Weight bearing as tolerated   Complete by:  As directed      Allergies as of 03/27/2018      Reactions   Gluten Meal Swelling   Lip swelling   Prometrium [progesterone Micronized] Other (See Comments)   Neurological sx of stroke   Shellfish Allergy Swelling   Lip swelling      Medication List    STOP taking these medications   ibuprofen 200 MG tablet Commonly known as:  ADVIL,MOTRIN   multivitamin with minerals Tabs tablet     TAKE these medications   HYDROcodone-acetaminophen 5-325 MG tablet Commonly known as:  NORCO/VICODIN Take 1-2 tablets by mouth every 6 (six) hours as needed for severe pain.   methocarbamol 500 MG tablet Commonly known as:  ROBAXIN Take 1 tablet (500 mg total)  by mouth every 6 (six) hours as needed for muscle spasms.   rivaroxaban 10 MG Tabs tablet Commonly known as:  XARELTO Take 1 tablet (10 mg total) by mouth daily  with breakfast for 20 days. Then take one 81 mg aspirin once a day for three weeks. Then discontinue aspirin.   traMADol 50 MG tablet Commonly known as:  ULTRAM Take 1-2 tablets (50-100 mg total) by mouth every 6 (six) hours as needed for moderate pain.            Discharge Care Instructions  (From admission, onward)         Start     Ordered   03/27/18 0000  Weight bearing as tolerated     03/27/18 0759   03/27/18 0000  Change dressing    Comments:  You may change your dressing on Friday, then change the dressing daily with sterile 4 x 4 inch gauze dressing and paper tape.   03/27/18 0759         Follow-up Information    Kaitlynne Wenz, [place], PA. Go on 04/15/2018.   Specialty:  Orthopedic Surgery Why:  You are scheduled for a post-operative appointment with Theresa Duty, PA for Dr. Wynelle Link on 04-15-18 at 2:15 pm. Contact information: [address] [place] [address] [phone]           Signed: Theresa Duty, PA-C Orthopedic Surgery 03/31/2018, 11:41 AM

## 2018-04-07 DIAGNOSIS — H5213 Myopia, bilateral: Secondary | ICD-10-CM | POA: Diagnosis not present

## 2018-04-07 DIAGNOSIS — H2513 Age-related nuclear cataract, bilateral: Secondary | ICD-10-CM | POA: Diagnosis not present

## 2018-04-07 DIAGNOSIS — H524 Presbyopia: Secondary | ICD-10-CM | POA: Diagnosis not present

## 2018-05-06 DIAGNOSIS — Z471 Aftercare following joint replacement surgery: Secondary | ICD-10-CM | POA: Diagnosis not present

## 2018-05-06 DIAGNOSIS — Z96642 Presence of left artificial hip joint: Secondary | ICD-10-CM | POA: Diagnosis not present

## 2018-05-13 DIAGNOSIS — L03115 Cellulitis of right lower limb: Secondary | ICD-10-CM | POA: Diagnosis not present

## 2018-05-14 DIAGNOSIS — T370X5D Adverse effect of sulfonamides, subsequent encounter: Secondary | ICD-10-CM | POA: Diagnosis not present

## 2018-05-14 DIAGNOSIS — L270 Generalized skin eruption due to drugs and medicaments taken internally: Secondary | ICD-10-CM | POA: Diagnosis not present

## 2018-05-26 DIAGNOSIS — L300 Nummular dermatitis: Secondary | ICD-10-CM | POA: Diagnosis not present

## 2018-06-18 DIAGNOSIS — Z5181 Encounter for therapeutic drug level monitoring: Secondary | ICD-10-CM | POA: Diagnosis not present

## 2018-06-18 DIAGNOSIS — M1610 Unilateral primary osteoarthritis, unspecified hip: Secondary | ICD-10-CM | POA: Diagnosis not present

## 2018-06-18 DIAGNOSIS — Z0000 Encounter for general adult medical examination without abnormal findings: Secondary | ICD-10-CM | POA: Diagnosis not present

## 2018-06-18 DIAGNOSIS — M8580 Other specified disorders of bone density and structure, unspecified site: Secondary | ICD-10-CM | POA: Diagnosis not present

## 2018-06-18 DIAGNOSIS — Z1211 Encounter for screening for malignant neoplasm of colon: Secondary | ICD-10-CM | POA: Diagnosis not present

## 2018-06-18 DIAGNOSIS — E785 Hyperlipidemia, unspecified: Secondary | ICD-10-CM | POA: Diagnosis not present

## 2018-06-20 DIAGNOSIS — J988 Other specified respiratory disorders: Secondary | ICD-10-CM | POA: Diagnosis not present

## 2018-10-13 DIAGNOSIS — Z03818 Encounter for observation for suspected exposure to other biological agents ruled out: Secondary | ICD-10-CM | POA: Diagnosis not present

## 2018-10-15 DIAGNOSIS — Z803 Family history of malignant neoplasm of breast: Secondary | ICD-10-CM | POA: Diagnosis not present

## 2018-10-15 DIAGNOSIS — Z1231 Encounter for screening mammogram for malignant neoplasm of breast: Secondary | ICD-10-CM | POA: Diagnosis not present

## 2018-10-16 DIAGNOSIS — Z85820 Personal history of malignant melanoma of skin: Secondary | ICD-10-CM | POA: Diagnosis not present

## 2018-10-16 DIAGNOSIS — D485 Neoplasm of uncertain behavior of skin: Secondary | ICD-10-CM | POA: Diagnosis not present

## 2018-10-16 DIAGNOSIS — L280 Lichen simplex chronicus: Secondary | ICD-10-CM | POA: Diagnosis not present

## 2018-10-16 DIAGNOSIS — L570 Actinic keratosis: Secondary | ICD-10-CM | POA: Diagnosis not present

## 2018-10-16 DIAGNOSIS — D229 Melanocytic nevi, unspecified: Secondary | ICD-10-CM | POA: Diagnosis not present

## 2018-11-06 ENCOUNTER — Encounter: Payer: Self-pay | Admitting: *Deleted

## 2019-01-01 DIAGNOSIS — L309 Dermatitis, unspecified: Secondary | ICD-10-CM | POA: Diagnosis not present

## 2019-01-01 DIAGNOSIS — L818 Other specified disorders of pigmentation: Secondary | ICD-10-CM | POA: Diagnosis not present

## 2019-01-01 DIAGNOSIS — D485 Neoplasm of uncertain behavior of skin: Secondary | ICD-10-CM | POA: Diagnosis not present

## 2019-03-09 DIAGNOSIS — M795 Residual foreign body in soft tissue: Secondary | ICD-10-CM | POA: Diagnosis not present

## 2019-03-09 DIAGNOSIS — L608 Other nail disorders: Secondary | ICD-10-CM | POA: Diagnosis not present

## 2019-03-11 ENCOUNTER — Other Ambulatory Visit: Payer: Self-pay | Admitting: Orthopedic Surgery

## 2019-03-11 DIAGNOSIS — S60454A Superficial foreign body of right ring finger, initial encounter: Secondary | ICD-10-CM

## 2019-03-26 DIAGNOSIS — Z96642 Presence of left artificial hip joint: Secondary | ICD-10-CM | POA: Diagnosis not present

## 2019-03-26 DIAGNOSIS — Z96641 Presence of right artificial hip joint: Secondary | ICD-10-CM | POA: Diagnosis not present

## 2019-04-08 ENCOUNTER — Other Ambulatory Visit: Payer: Self-pay | Admitting: Orthopedic Surgery

## 2019-04-08 ENCOUNTER — Ambulatory Visit
Admission: RE | Admit: 2019-04-08 | Discharge: 2019-04-08 | Disposition: A | Discharge status: Home / Self Care | Payer: Medicare Other | Source: Ambulatory Visit | Attending: Orthopedic Surgery | Admitting: Orthopedic Surgery

## 2019-04-08 DIAGNOSIS — S60454A Superficial foreign body of right ring finger, initial encounter: Secondary | ICD-10-CM

## 2019-05-26 DIAGNOSIS — M25512 Pain in left shoulder: Secondary | ICD-10-CM | POA: Diagnosis not present

## 2019-05-26 DIAGNOSIS — M19012 Primary osteoarthritis, left shoulder: Secondary | ICD-10-CM | POA: Diagnosis not present

## 2019-07-31 DIAGNOSIS — K529 Noninfective gastroenteritis and colitis, unspecified: Secondary | ICD-10-CM | POA: Diagnosis not present

## 2019-09-15 DIAGNOSIS — T63461A Toxic effect of venom of wasps, accidental (unintentional), initial encounter: Secondary | ICD-10-CM | POA: Diagnosis not present

## 2019-09-15 DIAGNOSIS — E785 Hyperlipidemia, unspecified: Secondary | ICD-10-CM | POA: Diagnosis not present

## 2019-09-15 DIAGNOSIS — Z0000 Encounter for general adult medical examination without abnormal findings: Secondary | ICD-10-CM | POA: Diagnosis not present

## 2019-09-15 DIAGNOSIS — I10 Essential (primary) hypertension: Secondary | ICD-10-CM | POA: Diagnosis not present

## 2020-01-11 ENCOUNTER — Other Ambulatory Visit: Payer: Self-pay

## 2020-01-11 ENCOUNTER — Encounter: Payer: Self-pay | Admitting: Physician Assistant

## 2020-01-11 ENCOUNTER — Ambulatory Visit (INDEPENDENT_AMBULATORY_CARE_PROVIDER_SITE_OTHER): Payer: Medicare Other | Admitting: Physician Assistant

## 2020-01-11 DIAGNOSIS — L821 Other seborrheic keratosis: Secondary | ICD-10-CM | POA: Diagnosis not present

## 2020-01-11 DIAGNOSIS — Z86018 Personal history of other benign neoplasm: Secondary | ICD-10-CM

## 2020-01-11 DIAGNOSIS — L814 Other melanin hyperpigmentation: Secondary | ICD-10-CM | POA: Diagnosis not present

## 2020-01-11 DIAGNOSIS — D485 Neoplasm of uncertain behavior of skin: Secondary | ICD-10-CM | POA: Diagnosis not present

## 2020-01-11 DIAGNOSIS — Z86006 Personal history of melanoma in-situ: Secondary | ICD-10-CM | POA: Diagnosis not present

## 2020-01-11 DIAGNOSIS — Z1283 Encounter for screening for malignant neoplasm of skin: Secondary | ICD-10-CM

## 2020-01-11 DIAGNOSIS — Z87898 Personal history of other specified conditions: Secondary | ICD-10-CM

## 2020-01-11 NOTE — Patient Instructions (Signed)

## 2020-01-11 NOTE — Progress Notes (Signed)
   Follow up Visit  Subjective  Regina [name] is a 74 y.o. female who presents for the following: Annual Exam (Concerns patients scalp has some bump like spots. ). History of melanoma in situ  Objective  Well appearing patient in no apparent distress; mood and affect are within normal limits.  A full examination was performed including scalp, head, eyes, ears, nose, lips, neck, chest, axillae, abdomen, back, buttocks, bilateral upper extremities, bilateral lower extremities, hands, feet, fingers, toes, fingernails, and toenails. All findings within normal limits unless otherwise noted below. No suspicious moles noted on back.   Objective  Mid Back: Full body skin check  Objective  Left Thigh - Anterior: Surgical scar examined no sign of recurrence. Excision-2019  Objective  Left Elbow - Posterior, Right Lower Leg - Posterior: Lentigo with atypia  Objective  Right Thigh - Anterior: Brown macule with irregular color     Objective  Scalp: Stuck-on, waxy papules and plaques.   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 Left Thigh - Anterior  History of atypical nevus (2) Left Elbow - Posterior; Right Lower Leg - Posterior  Neoplasm of uncertain behavior of skin Right Thigh - Anterior  Skin / nail biopsy Type of biopsy: tangential   Informed consent: discussed and consent obtained   Timeout: patient name, date of birth, surgical site, and procedure verified   Procedure prep:  Patient was prepped and draped in usual sterile fashion (Non sterile) Prep type:  Chlorhexidine Anesthesia: the lesion was anesthetized in a standard fashion   Anesthetic:  1% lidocaine w/ epinephrine 1-100,000 local infiltration Instrument used: flexible razor blade   Outcome: patient tolerated procedure well   Post-procedure details: wound care instructions given    Specimen 1 - Surgical pathology Differential Diagnosis:  atypia Check Margins: No  Seborrheic keratosis Scalp  No treatment

## 2020-01-24 DIAGNOSIS — Z23 Encounter for immunization: Secondary | ICD-10-CM | POA: Diagnosis not present

## 2020-01-26 ENCOUNTER — Telehealth: Payer: Self-pay | Admitting: Dermatology

## 2020-01-26 NOTE — Telephone Encounter (Signed)
Patient is calling for the pathology results from her last visit with Arlyss Gandy, PA-C.

## 2020-01-27 NOTE — Telephone Encounter (Signed)
Pathology to patient.  °

## 2020-04-13 DIAGNOSIS — H2513 Age-related nuclear cataract, bilateral: Secondary | ICD-10-CM | POA: Diagnosis not present

## 2020-04-13 DIAGNOSIS — H5213 Myopia, bilateral: Secondary | ICD-10-CM | POA: Diagnosis not present

## 2020-04-13 DIAGNOSIS — H524 Presbyopia: Secondary | ICD-10-CM | POA: Diagnosis not present

## 2020-04-13 DIAGNOSIS — H25013 Cortical age-related cataract, bilateral: Secondary | ICD-10-CM | POA: Diagnosis not present

## 2020-05-23 DIAGNOSIS — L814 Other melanin hyperpigmentation: Secondary | ICD-10-CM | POA: Diagnosis not present

## 2020-05-23 DIAGNOSIS — Z85820 Personal history of malignant melanoma of skin: Secondary | ICD-10-CM | POA: Diagnosis not present

## 2020-05-23 DIAGNOSIS — L820 Inflamed seborrheic keratosis: Secondary | ICD-10-CM | POA: Diagnosis not present

## 2020-05-23 DIAGNOSIS — B078 Other viral warts: Secondary | ICD-10-CM | POA: Diagnosis not present

## 2020-05-23 DIAGNOSIS — D225 Melanocytic nevi of trunk: Secondary | ICD-10-CM | POA: Diagnosis not present

## 2020-05-23 DIAGNOSIS — L821 Other seborrheic keratosis: Secondary | ICD-10-CM | POA: Diagnosis not present

## 2020-05-23 DIAGNOSIS — L905 Scar conditions and fibrosis of skin: Secondary | ICD-10-CM | POA: Diagnosis not present

## 2020-06-09 ENCOUNTER — Other Ambulatory Visit: Payer: Self-pay

## 2020-06-09 ENCOUNTER — Encounter: Payer: Self-pay | Admitting: Sports Medicine

## 2020-06-09 ENCOUNTER — Ambulatory Visit (INDEPENDENT_AMBULATORY_CARE_PROVIDER_SITE_OTHER): Payer: Medicare Other | Admitting: Sports Medicine

## 2020-06-09 VITALS — BP 120/70 | Ht 61.0 in | Wt 120.0 lb

## 2020-06-09 DIAGNOSIS — M25561 Pain in right knee: Secondary | ICD-10-CM

## 2020-06-09 NOTE — Progress Notes (Signed)
   Subjective:    Patient ID: Regina [name], female    DOB: 1945-06-20, 75 y.o.   MRN: [id number]  HPI chief complaint: Right knee pain  Very pleasant 75 year old female comes in today after having injured her right knee 2 weeks ago.  She was walking a rather heavy dog when the dog decided to go one way and she went the other.  She felt immediate pain in the lateral knee but it improved enough that she was able to play tennis the next day.  She was also able to walk a little over 4 miles but she began to experience increasing discomfort thereafter.  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  All of her pain is along the lateral knee.  She does not recall a pop at the time of the injury.  No catching.  She did not notice any swelling.  She had a similar injury in 2017 which was diagnosed as a lateral meniscal tear.  She made a full recovery with conservative treatment at that time.  Her current symptoms are identical to what she experienced in 2017.  Past medical history reviewed Medications reviewed Allergies reviewed    Review of Systems As above    Objective:   Physical Exam  Well-developed, well-nourished.  No acute distress  Right knee: Full range of motion.  No obvious effusion.  She is tender to palpation along the lateral joint line but has a negative Thessaly's.  No tenderness along the medial joint line.  Knee is stable to valgus and varus stressing although varus stressing does reproduce some mild lateral knee pain.  Negative anterior drawer, negative posterior drawer.  1+ patellofemoral crepitus with active extension.  Neurovascularly intact distally.  Walking without a noticeable limp.  Limited MSK ultrasound of the right knee was performed.  Patient has a very small knee effusion with osteophytes noted over the medial compartment.  Visualized medial meniscus appears unremarkable.  Lateral  compartment shows some slight spurring as well and the visualized lateral meniscus appears unremarkable although there are calcifications within the lateral meniscus likely from her previous injury.      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  She does have a body helix compression sleeve but it is quite old.  We will provide her with a new sleeve today to wear when active.  She will also start daily isometric quad exercises.  She may use topical pain relievers, including capsaicin, if needed.  Starting next week, she may slowly return to tennis using pain as her guide.  I recommended ice after activity.  If symptoms persist or worsen then I would consider merits of x-ray at that time.  We also discussed the possibility of a cortisone injection if symptoms persist although she is hesitant about this.  Follow-up for ongoing or recalcitrant issues.

## 2020-10-05 DIAGNOSIS — Z0000 Encounter for general adult medical examination without abnormal findings: Secondary | ICD-10-CM | POA: Diagnosis not present

## 2020-10-05 DIAGNOSIS — I10 Essential (primary) hypertension: Secondary | ICD-10-CM | POA: Diagnosis not present

## 2020-10-05 DIAGNOSIS — M1610 Unilateral primary osteoarthritis, unspecified hip: Secondary | ICD-10-CM | POA: Diagnosis not present

## 2020-10-05 DIAGNOSIS — I8393 Asymptomatic varicose veins of bilateral lower extremities: Secondary | ICD-10-CM | POA: Diagnosis not present

## 2020-10-05 DIAGNOSIS — E785 Hyperlipidemia, unspecified: Secondary | ICD-10-CM | POA: Diagnosis not present

## 2020-10-08 DIAGNOSIS — Z03818 Encounter for observation for suspected exposure to other biological agents ruled out: Secondary | ICD-10-CM | POA: Diagnosis not present

## 2020-10-26 DIAGNOSIS — Z803 Family history of malignant neoplasm of breast: Secondary | ICD-10-CM | POA: Diagnosis not present

## 2020-10-26 DIAGNOSIS — Z1231 Encounter for screening mammogram for malignant neoplasm of breast: Secondary | ICD-10-CM | POA: Diagnosis not present

## 2020-11-02 DIAGNOSIS — R922 Inconclusive mammogram: Secondary | ICD-10-CM | POA: Diagnosis not present

## 2020-11-02 DIAGNOSIS — R928 Other abnormal and inconclusive findings on diagnostic imaging of breast: Secondary | ICD-10-CM | POA: Diagnosis not present

## 2020-11-24 DIAGNOSIS — Z780 Asymptomatic menopausal state: Secondary | ICD-10-CM | POA: Diagnosis not present

## 2020-11-24 DIAGNOSIS — M85832 Other specified disorders of bone density and structure, left forearm: Secondary | ICD-10-CM | POA: Diagnosis not present

## 2020-12-06 IMAGING — RF DG C-ARM 61-120 MIN
1 series · 3 of 3 positions shown · non-contrast
Comparison: None.

CLINICAL DATA: Left hip replacement

EXAM:
DG C-ARM 61-120 MIN

[Series 1: run · 3 of 3 slices shown]
[im 1/3]
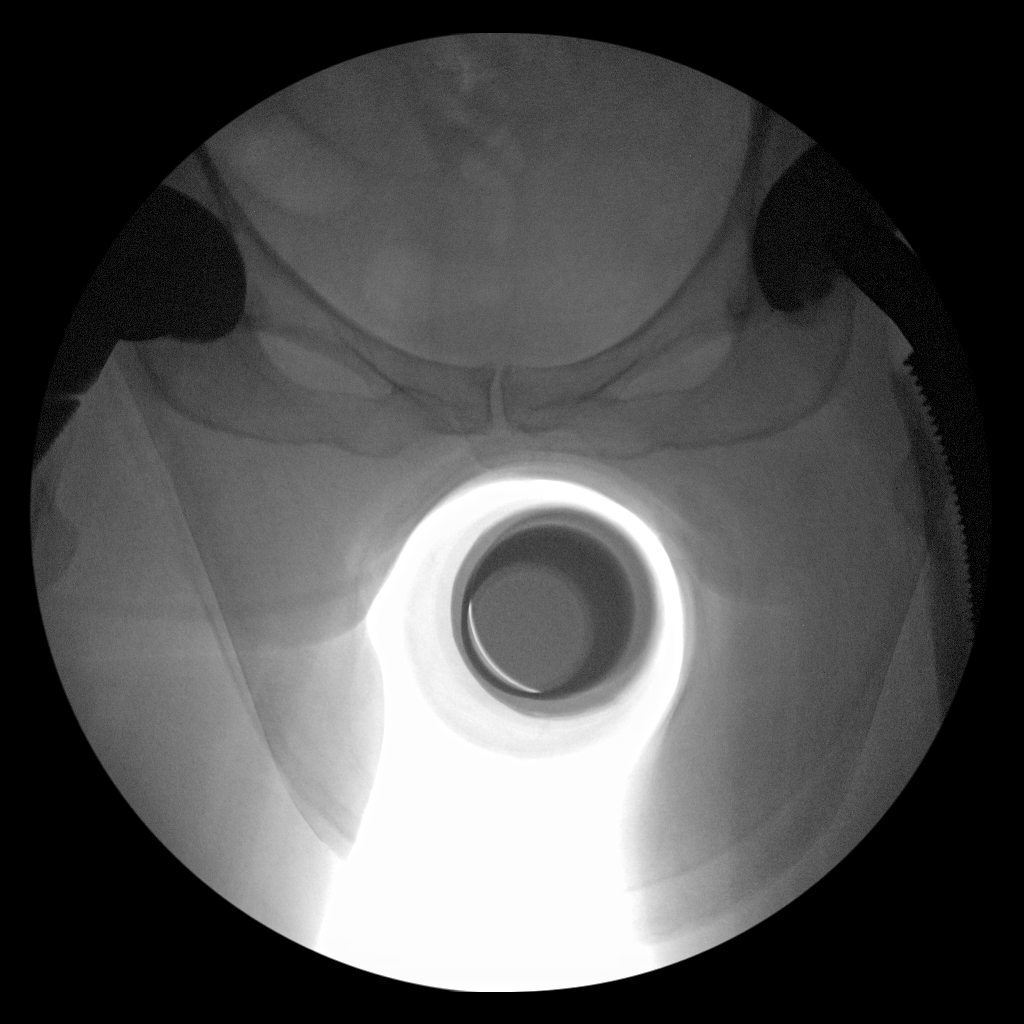
[im 2/3]
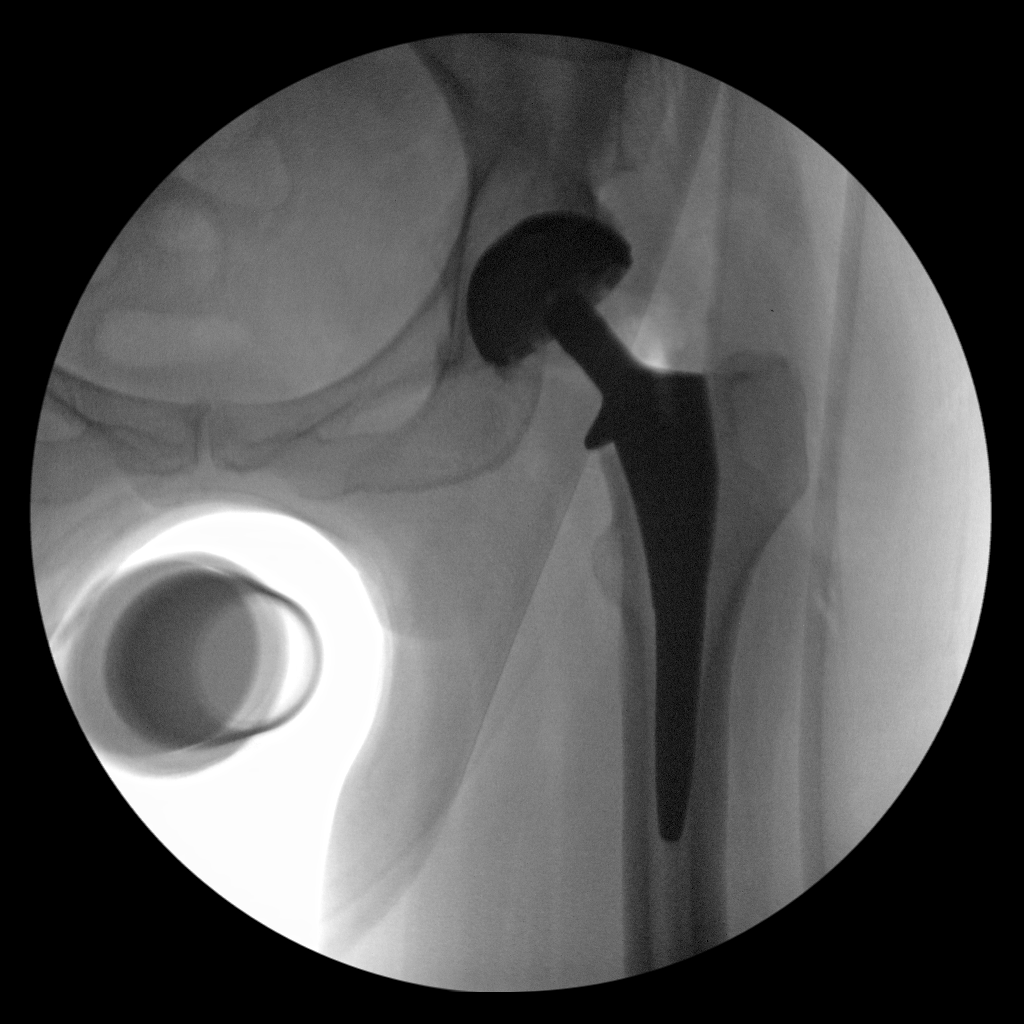
[im 3/3]
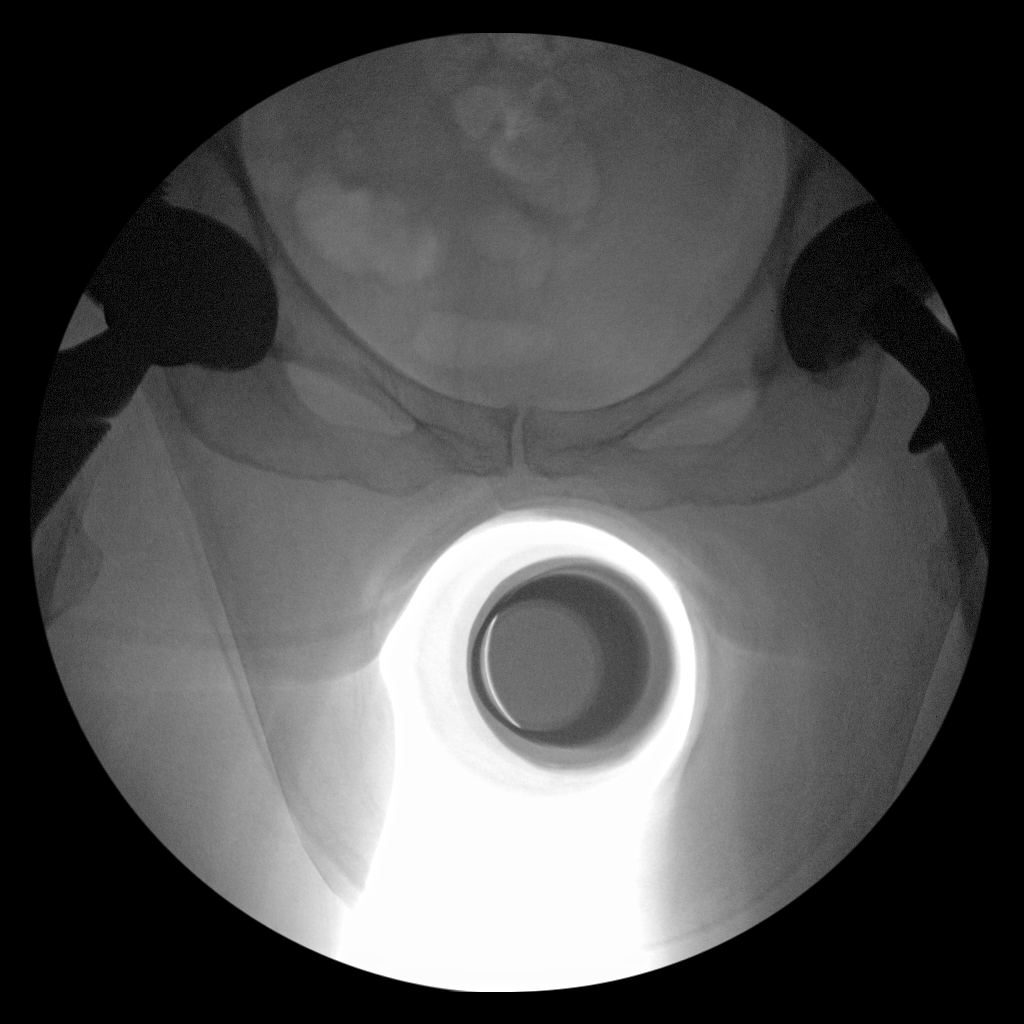

[3 of 3 positions shown; findings below may reference images not displayed]

FINDINGS: Three low resolution intraoperative spot views of the left hip.
Total fluoroscopy time was 14 seconds. Evidence of prior right hip
replacement. Status post left hip replacement with normal alignment.
IMPRESSION: Intraoperative fluoroscopic assistance provided during left hip
replacement

## 2020-12-06 IMAGING — DX DG PORTABLE PELVIS
1 series · 1 of 1 positions shown · non-contrast
Comparison: 03/14/2004

CLINICAL DATA: Hip replacement

EXAM:
PORTABLE PELVIS 1-2 VIEWS

[pelvis ap]
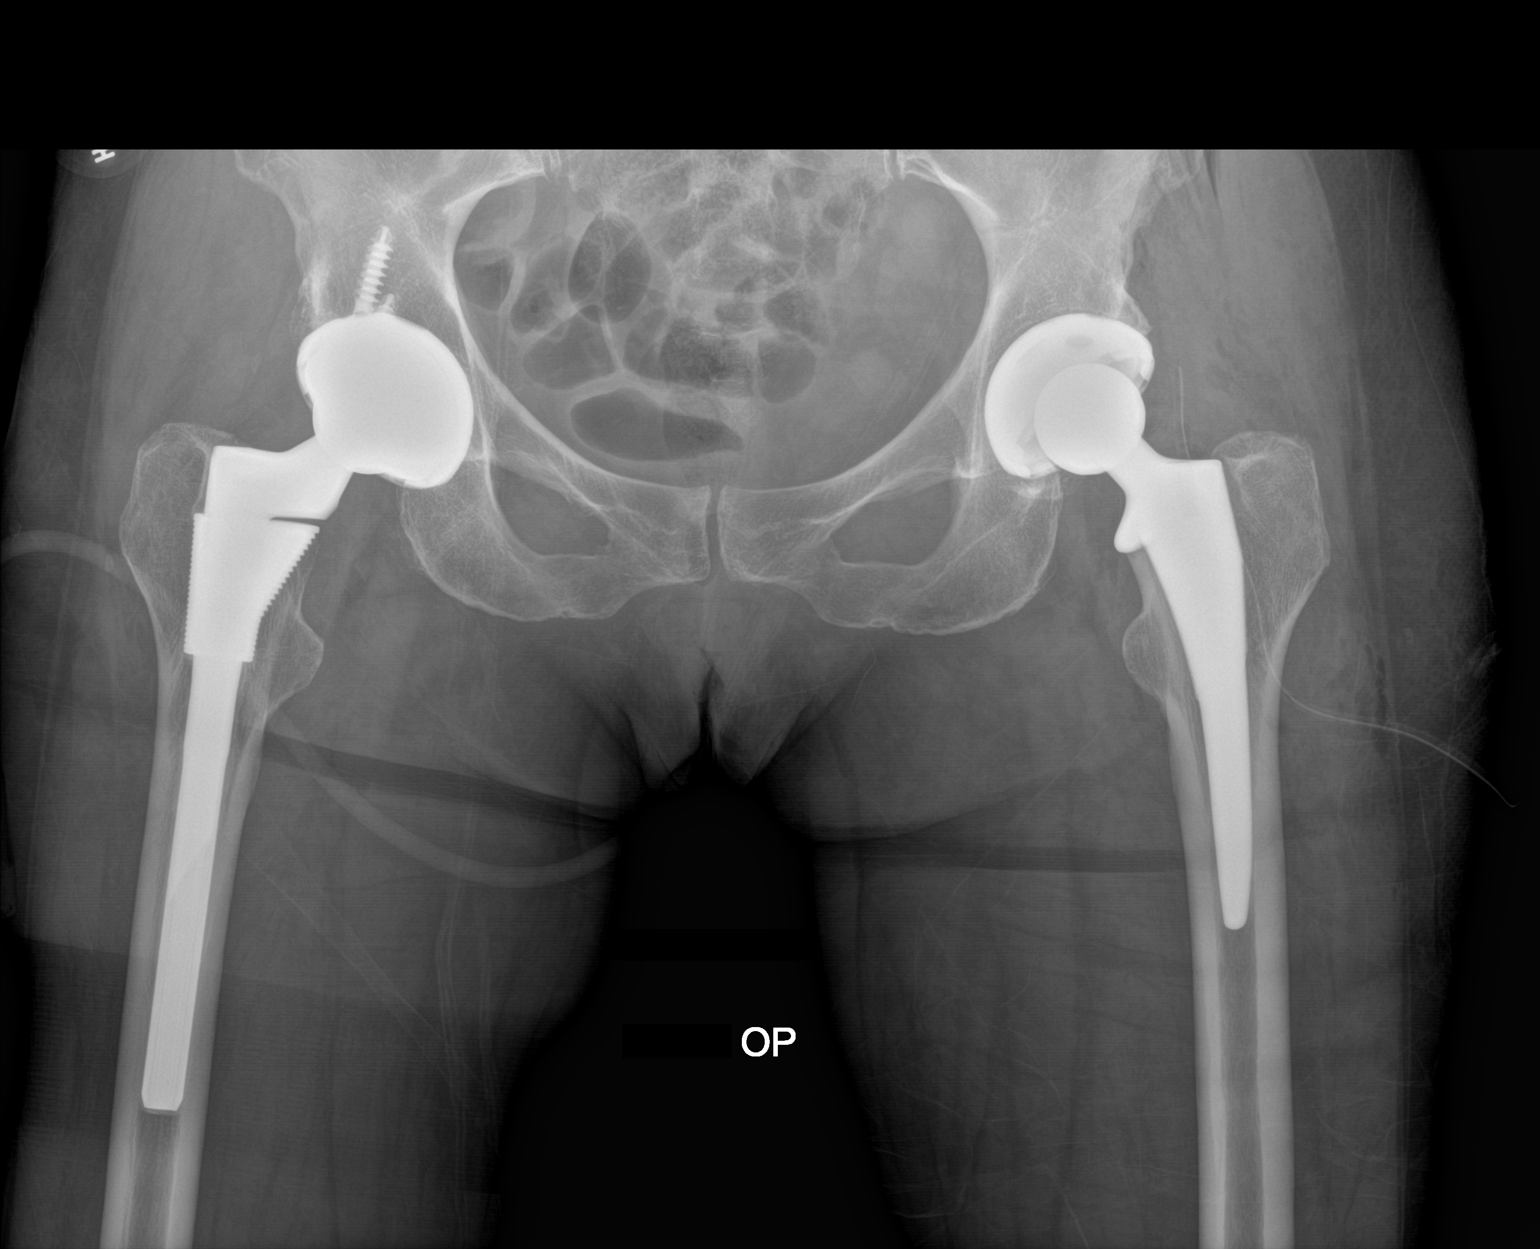

[1 of 1 positions shown; findings below may reference images not displayed]

FINDINGS: There are bilateral hip replacements which demonstrate normal
alignment. There is no fracture. The pubic symphysis is intact.
Surgical drain over the left hip with small foci of soft tissue gas.
IMPRESSION: Left hip replacement with expected postsurgical changes

## 2021-01-24 DIAGNOSIS — Z23 Encounter for immunization: Secondary | ICD-10-CM | POA: Diagnosis not present

## 2021-03-30 DIAGNOSIS — Z96642 Presence of left artificial hip joint: Secondary | ICD-10-CM | POA: Diagnosis not present

## 2021-04-13 DIAGNOSIS — H2513 Age-related nuclear cataract, bilateral: Secondary | ICD-10-CM | POA: Diagnosis not present

## 2021-04-13 DIAGNOSIS — H524 Presbyopia: Secondary | ICD-10-CM | POA: Diagnosis not present

## 2021-04-13 DIAGNOSIS — H25013 Cortical age-related cataract, bilateral: Secondary | ICD-10-CM | POA: Diagnosis not present

## 2021-04-24 DIAGNOSIS — M8580 Other specified disorders of bone density and structure, unspecified site: Secondary | ICD-10-CM | POA: Diagnosis not present

## 2021-05-24 DIAGNOSIS — Z08 Encounter for follow-up examination after completed treatment for malignant neoplasm: Secondary | ICD-10-CM | POA: Diagnosis not present

## 2021-05-24 DIAGNOSIS — Z85820 Personal history of malignant melanoma of skin: Secondary | ICD-10-CM | POA: Diagnosis not present

## 2021-05-24 DIAGNOSIS — L814 Other melanin hyperpigmentation: Secondary | ICD-10-CM | POA: Diagnosis not present

## 2021-05-24 DIAGNOSIS — L821 Other seborrheic keratosis: Secondary | ICD-10-CM | POA: Diagnosis not present

## 2021-05-24 DIAGNOSIS — D1801 Hemangioma of skin and subcutaneous tissue: Secondary | ICD-10-CM | POA: Diagnosis not present

## 2021-06-08 ENCOUNTER — Other Ambulatory Visit (HOSPITAL_BASED_OUTPATIENT_CLINIC_OR_DEPARTMENT_OTHER): Payer: Self-pay

## 2021-06-08 MED ORDER — ZOSTER VAC RECOMB ADJUVANTED 50 MCG/0.5ML IM SUSR
INTRAMUSCULAR | 0 refills | Status: AC
  Filled 2021-06-08: qty 0.5, 1d supply, fill #0

## 2021-06-22 ENCOUNTER — Other Ambulatory Visit (HOSPITAL_BASED_OUTPATIENT_CLINIC_OR_DEPARTMENT_OTHER): Payer: Self-pay

## 2021-07-26 ENCOUNTER — Ambulatory Visit: Payer: Medicare Other

## 2021-08-03 ENCOUNTER — Other Ambulatory Visit (HOSPITAL_BASED_OUTPATIENT_CLINIC_OR_DEPARTMENT_OTHER): Payer: Self-pay

## 2021-08-16 DIAGNOSIS — L820 Inflamed seborrheic keratosis: Secondary | ICD-10-CM | POA: Diagnosis not present

## 2021-08-16 DIAGNOSIS — Z86006 Personal history of melanoma in-situ: Secondary | ICD-10-CM | POA: Diagnosis not present

## 2021-08-16 DIAGNOSIS — D1801 Hemangioma of skin and subcutaneous tissue: Secondary | ICD-10-CM | POA: Diagnosis not present

## 2021-08-16 DIAGNOSIS — Z08 Encounter for follow-up examination after completed treatment for malignant neoplasm: Secondary | ICD-10-CM | POA: Diagnosis not present

## 2021-08-16 DIAGNOSIS — Z789 Other specified health status: Secondary | ICD-10-CM | POA: Diagnosis not present

## 2021-08-16 DIAGNOSIS — L538 Other specified erythematous conditions: Secondary | ICD-10-CM | POA: Diagnosis not present

## 2021-08-16 DIAGNOSIS — L821 Other seborrheic keratosis: Secondary | ICD-10-CM | POA: Diagnosis not present

## 2021-08-16 DIAGNOSIS — L814 Other melanin hyperpigmentation: Secondary | ICD-10-CM | POA: Diagnosis not present

## 2021-09-14 ENCOUNTER — Other Ambulatory Visit (HOSPITAL_BASED_OUTPATIENT_CLINIC_OR_DEPARTMENT_OTHER): Payer: Self-pay

## 2021-09-14 MED ORDER — ZOSTER VAC RECOMB ADJUVANTED 50 MCG/0.5ML IM SUSR
INTRAMUSCULAR | 0 refills | Status: AC
  Filled 2021-09-14: qty 0.5, 1d supply, fill #0

## 2021-09-15 ENCOUNTER — Other Ambulatory Visit (HOSPITAL_BASED_OUTPATIENT_CLINIC_OR_DEPARTMENT_OTHER): Payer: Self-pay

## 2021-10-09 DIAGNOSIS — E785 Hyperlipidemia, unspecified: Secondary | ICD-10-CM | POA: Diagnosis not present

## 2021-10-09 DIAGNOSIS — Z0000 Encounter for general adult medical examination without abnormal findings: Secondary | ICD-10-CM | POA: Diagnosis not present

## 2021-10-09 DIAGNOSIS — Z79899 Other long term (current) drug therapy: Secondary | ICD-10-CM | POA: Diagnosis not present

## 2021-10-09 DIAGNOSIS — S99921A Unspecified injury of right foot, initial encounter: Secondary | ICD-10-CM | POA: Diagnosis not present

## 2021-11-07 DIAGNOSIS — Z1231 Encounter for screening mammogram for malignant neoplasm of breast: Secondary | ICD-10-CM | POA: Diagnosis not present

## 2022-01-22 ENCOUNTER — Other Ambulatory Visit (HOSPITAL_BASED_OUTPATIENT_CLINIC_OR_DEPARTMENT_OTHER): Payer: Self-pay

## 2022-01-25 ENCOUNTER — Other Ambulatory Visit (HOSPITAL_BASED_OUTPATIENT_CLINIC_OR_DEPARTMENT_OTHER): Payer: Self-pay

## 2022-01-25 MED ORDER — COVID-19 MRNA 2023-2024 VACCINE (COMIRNATY) 0.3 ML INJECTION
INTRAMUSCULAR | 0 refills | Status: AC
  Filled 2022-01-25: qty 0.3, 1d supply, fill #0

## 2022-02-08 ENCOUNTER — Encounter: Payer: Self-pay | Admitting: Neurology

## 2022-02-08 ENCOUNTER — Other Ambulatory Visit (HOSPITAL_COMMUNITY): Payer: Self-pay | Admitting: Family Medicine

## 2022-02-08 DIAGNOSIS — G4452 New daily persistent headache (NDPH): Secondary | ICD-10-CM | POA: Diagnosis not present

## 2022-02-09 ENCOUNTER — Ambulatory Visit (HOSPITAL_COMMUNITY)
Admission: RE | Admit: 2022-02-09 | Discharge: 2022-02-09 | Disposition: A | Discharge status: Home / Self Care | Payer: PPO | Source: Ambulatory Visit | Attending: Family Medicine | Admitting: Family Medicine

## 2022-02-09 DIAGNOSIS — G4452 New daily persistent headache (NDPH): Secondary | ICD-10-CM | POA: Diagnosis not present

## 2022-02-09 DIAGNOSIS — R519 Headache, unspecified: Secondary | ICD-10-CM | POA: Diagnosis not present

## 2022-02-09 MED ORDER — GADOBUTROL 1 MMOL/ML IV SOLN
5.0000 mL | Freq: Once | INTRAVENOUS | Status: AC | PRN
  Administered 2022-02-09: 5 mL via INTRAVENOUS

## 2022-03-22 DIAGNOSIS — H5213 Myopia, bilateral: Secondary | ICD-10-CM | POA: Diagnosis not present

## 2022-03-22 DIAGNOSIS — H2513 Age-related nuclear cataract, bilateral: Secondary | ICD-10-CM | POA: Diagnosis not present

## 2022-03-22 DIAGNOSIS — H52203 Unspecified astigmatism, bilateral: Secondary | ICD-10-CM | POA: Diagnosis not present

## 2022-03-22 DIAGNOSIS — H25013 Cortical age-related cataract, bilateral: Secondary | ICD-10-CM | POA: Diagnosis not present

## 2022-04-04 ENCOUNTER — Ambulatory Visit: Payer: PPO | Admitting: Neurology

## 2022-04-04 ENCOUNTER — Encounter: Payer: Self-pay | Admitting: Neurology

## 2022-04-04 VITALS — BP 131/63 | HR 72 | Ht 61.0 in | Wt 125.0 lb

## 2022-04-04 DIAGNOSIS — R519 Headache, unspecified: Secondary | ICD-10-CM | POA: Diagnosis not present

## 2022-04-04 NOTE — Progress Notes (Signed)
[facility]  PATIENT: Regina [name] DOB: 05-May-1945  REFERRING DOCTOR OR PCP: Sela Hilding, MD SOURCE: Patient, notes from primary care, imaging reports, MRI images personally reviewed.  _________________________________   HISTORICAL  CHIEF COMPLAINT:  Chief Complaint  Patient presents with   New Patient (Initial Visit)    Pt in room #10 and alone. Pt here today to discuss headaches.    HISTORY OF PRESENT ILLNESS:  I had the pleasure of seeing your patient, Thana Ramp, at [facility] for neurologic consultation regarding her headaches.  She is a 76 year old woman who had the onset of headaches in September in the right  temporal region.   Pain is dull not pounding.   Taking Tylenol eliminates the pain.    No position or movement alters the pain.   She denies nausea.   She has a FH of GBM ad had an MRI 02/09/2022.   HA was better on vacation but has since resumed.      She denies change in vision and recent eye exam was fine.     She denies weakness, numbness or clumsiness.      She has been in good health and eats well and exercises.     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   She saw her dentist. She has a new mouthguard 6 months ago.    She has used them x decades.   TMJ is usually worse on the left but the right also clicks.    She uses the bite guard nightly.   MRI of the brain 02/09/2022 was personally reviewed.  It shows few T2/FLAIR hyperintense foci in the subcortical white matter of the frontal lobes.  Additionally, there is a single chronic microhemorrhage in the right temporoparietal region.  There were no acute findings.  Paranasal sinuses and mastoid air cells look normal.  Enhancement pattern.   REVIEW OF SYSTEMS: Constitutional: No fevers, chills, sweats, or change in appetite Eyes: No visual changes, double vision, eye pain Ear, nose and throat: No hearing loss, ear pain, nasal congestion, sore  throat Cardiovascular: No chest pain, palpitations Respiratory:  No shortness of breath at rest or with exertion.   No wheezes GastrointestinaI: No nausea, vomiting, diarrhea, abdominal pain, fecal incontinence Genitourinary:  No dysuria, urinary retention or frequency.  No nocturia. Musculoskeletal:  No neck pain, back pain Integumentary: No rash, pruritus, skin lesions Neurological: as above Psychiatric: No depression at this time.  No anxiety Endocrine: No palpitations, diaphoresis, change in appetite, change in weigh or increased thirst Hematologic/Lymphatic:  No anemia, purpura, petechiae. Allergic/Immunologic: No itchy/runny eyes, nasal congestion, recent allergic reactions, rashes  ALLERGIES: Allergies  Allergen Reactions   Prometrium [Progesterone] Other (See Comments)    Neurological sx of stroke   Shellfish Allergy Swelling    Lip swelling    HOME MEDICATIONS:  Current Outpatient Medications:    COVID-19 mRNA vaccine 2023-2024 (COMIRNATY) SUSP injection, Inject into the muscle., Disp: 0.3 mL, Rfl: 0   ibuprofen (ADVIL) 400 MG tablet, Take by mouth., Disp: , Rfl:    Multiple Vitamin (MULTIVITAMIN) capsule, Take 1 capsule by mouth daily., Disp: , Rfl:    Zoster Vaccine Adjuvanted [facility]) injection, Inject into the muscle., Disp: 0.5 mL, Rfl: 0   Zoster Vaccine Adjuvanted [facility]) injection, Inject into the muscle., Disp: 0.5 mL, Rfl: 0  PAST MEDICAL HISTORY: Past Medical History:  Diagnosis Date   Arthritis    Atypical nevus 02/27/2005   MILD ATYPIA-  RIGHT CALF- NO TX CLEAR   Complication of anesthesia    Melanoma ([place]) 09/16/2017   IN SITU- LEFT UPPER THIGH- TX EXCISION   PONV (postoperative nausea and vomiting)     PAST SURGICAL HISTORY: Past Surgical History:  Procedure Laterality Date   REPLACEMENT TOTAL HIP W/  RESURFACING IMPLANTS  2005   SHOULDER ADHESION RELEASE  1997   TOTAL HIP ARTHROPLASTY Left 03/26/2018   Procedure: LEFT TOTAL HIP  ARTHROPLASTY ANTERIOR APPROACH;  Surgeon: Gaynelle Arabian, MD;  Location: [facility];  Service: Orthopedics;  Laterality: Left;  149mn    FAMILY HISTORY: Family History  Problem Relation Age of Onset   Cancer Mother    Heart attack Father    Breast cancer Sister     SOCIAL HISTORY: Social History   Socioeconomic History   Marital status: Married    Spouse name: Not on file   Number of children: Not on file   Years of education: Not on file   Highest education level: Not on file  Occupational History   Not on file  Tobacco Use   Smoking status: Never   Smokeless tobacco: Never  Vaping Use   Vaping Use: Never used  Substance and Sexual Activity   Alcohol use: Yes    Alcohol/week: 3.0 standard drinks of alcohol    Types: 3 Glasses of wine per week   Drug use: No   Sexual activity: Not on file  Other Topics Concern   Not on file  Social History Narrative   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   Social Determinants of Health   Financial Resource Strain: Not on file  Food Insecurity: Not on file  Transportation Needs: Not on file  Physical Activity: Not on file  Stress: Not on file  Social Connections: Not on file  Intimate Partner Violence: Not on file       PHYSICAL EXAM  Vitals:   04/04/22 1246  BP: 131/63  Pulse: 72  Weight: 125 lb (56.7 kg)  Height: _0  (1.549 m)    Body mass index is 23.62 kg/m.   General: The patient is well-developed and well-nourished and in no acute distress  No temporal tenderness  HEENT:  Head is [place]/AT.  Sclera are anicteric.  Funduscopic exam shows normal optic discs and retinal vessels.  Neck: No carotid bruits are noted.  The neck has mild occipital tenderness.  Cardiovascular: The heart has a regular rate and rhythm with a normal S1 and S2. There were no murmurs, gallops or rubs.    Skin: Extremities are without rash or   edema.  Musculoskeletal:  Back is nontender  Neurologic Exam  Mental status: The patient is alert and oriented x 3 at the time of the examination. The patient has apparent normal recent and remote memory, with an apparently normal attention span and concentration ability.   Speech is normal.  Cranial nerves: Extraocular movements are full. Pupils are equal, round, and reactive to light and accomodation.  Visual fields are full.  Facial symmetry is present. There is good facial sensation to soft touch bilaterally.Facial strength is normal.  Trapezius and sternocleidomastoid strength is normal. No dysarthria is noted.  The tongue is midline, and the patient has symmetric elevation of the soft palate. No obvious hearing deficits are noted.  Motor:  Muscle bulk is normal.   Tone is normal. Strength is  5 / 5 in all  4 extremities.   Sensory: Sensory testing is intact to pinprick, soft touch and vibration sensation in all 4 extremities.  Coordination: Cerebellar testing reveals good finger-nose-finger and heel-to-shin bilaterally.  Gait and station: Station is normal.   Gait is normal. Tandem gait is normal. Romberg is negative.   Reflexes: Deep tendon reflexes are symmetric and normal bilaterally.   Plantar responses are flexor.    DIAGNOSTIC DATA (LABS, IMAGING, TESTING) - I reviewed patient records, labs, notes, testing and imaging myself where available.  Lab Results  Component Value Date   WBC 11.5 (H) 03/27/2018   HGB 11.2 (L) 03/27/2018   HCT 33.8 (L) 03/27/2018   MCV 98.3 03/27/2018   PLT 209 03/27/2018      Component Value Date/Time   NA 137 03/27/2018 0422   K 4.2 03/27/2018 0422   CL 102 03/27/2018 0422   CO2 27 03/27/2018 0422   GLUCOSE 137 (H) 03/27/2018 0422   BUN 8 03/27/2018 0422   CREATININE 0.56 03/27/2018 0422   CALCIUM 8.6 (L) 03/27/2018 0422   PROT 6.8 03/17/2018 1622   ALBUMIN 4.2 03/17/2018 1622   AST 19 03/17/2018 1622   ALT 14 03/17/2018 1622    ALKPHOS 62 03/17/2018 1622   BILITOT 0.5 03/17/2018 1622   GFRNONAA >60 03/27/2018 0422   GFRAA >60 03/27/2018 0422       ASSESSMENT AND PLAN  New onset headache - Plan: Sedimentation rate, C-reactive protein   In summary, Ms. Sauser is a 76 year old woman with a new onset headache since September 2023.  Her exam showed minimal tenderness in the occiput but was otherwise normal.  MRI of the brain was normal for age.  There is very mild chronic microvascular ischemic change and 1 chronic microhemorrhage.  I will check ESR and CRP to determine if there is any evidence of giant cell arteritis.  If these tests are elevated we will consider initiating treatment and checking a temporal artery biopsy.  I discussed with her that the majority of the time there is not a definite cause of new onset headache identified but that the headaches will often slowly improve.  She is reluctant to start any prophylactic medication.  We did discuss that magnesium will sometimes be beneficial and she will start that at 200 to 300 mg twice a day.  She could also add riboflavin 25 mg daily.  If pain worsens consider a prescription prophylactic medication such as a tricyclic.  She will return to see me as needed but based on the results of the studies and if symptoms worsen.  Thank you for asking me to see Ms. Maas.  Please let me know if I can be of further assistance with her or other patients in the future.   Elhadj Girton A. Felecia Shelling, MD, [facility] 18/33/5825, 1:89 PM Certified in Neurology, Clinical Neurophysiology, Sleep Medicine and Neuroimaging  [facility] [address], [place], [place] [address] [phone]

## 2022-04-04 NOTE — Patient Instructions (Addendum)
Magnesium 200-300 mg twice daily Riboflavin (B2) 25 mg daily

## 2022-04-05 LAB — SEDIMENTATION RATE: Sed Rate: 2 mm/hr (ref 0–40)

## 2022-04-05 LAB — C-REACTIVE PROTEIN: CRP: <1 mg/L (ref 0–10)

## 2022-05-28 DIAGNOSIS — D1801 Hemangioma of skin and subcutaneous tissue: Secondary | ICD-10-CM | POA: Diagnosis not present

## 2022-05-28 DIAGNOSIS — Z86006 Personal history of melanoma in-situ: Secondary | ICD-10-CM | POA: Diagnosis not present

## 2022-05-28 DIAGNOSIS — Z08 Encounter for follow-up examination after completed treatment for malignant neoplasm: Secondary | ICD-10-CM | POA: Diagnosis not present

## 2022-05-28 DIAGNOSIS — L821 Other seborrheic keratosis: Secondary | ICD-10-CM | POA: Diagnosis not present

## 2022-05-28 DIAGNOSIS — L814 Other melanin hyperpigmentation: Secondary | ICD-10-CM | POA: Diagnosis not present

## 2022-06-07 ENCOUNTER — Other Ambulatory Visit (HOSPITAL_BASED_OUTPATIENT_CLINIC_OR_DEPARTMENT_OTHER): Payer: Self-pay

## 2022-06-07 DIAGNOSIS — N907 Vulvar cyst: Secondary | ICD-10-CM | POA: Diagnosis not present

## 2022-06-07 MED ORDER — MUPIROCIN 2 % EX OINT
TOPICAL_OINTMENT | CUTANEOUS | 0 refills | Status: AC
  Filled 2022-06-07: qty 22, 14d supply, fill #0

## 2022-06-15 ENCOUNTER — Ambulatory Visit (HOSPITAL_BASED_OUTPATIENT_CLINIC_OR_DEPARTMENT_OTHER): Payer: PPO | Admitting: Medical

## 2022-07-03 ENCOUNTER — Ambulatory Visit: Payer: Self-pay | Admitting: Neurology

## 2022-07-07 ENCOUNTER — Other Ambulatory Visit (HOSPITAL_BASED_OUTPATIENT_CLINIC_OR_DEPARTMENT_OTHER): Payer: Self-pay

## 2022-07-07 MED ORDER — COVID-19 AT HOME ANTIGEN TEST VI KIT
PACK | 0 refills | Status: AC
  Filled 2022-07-07: qty 1, 1d supply, fill #0

## 2022-07-13 ENCOUNTER — Other Ambulatory Visit (HOSPITAL_BASED_OUTPATIENT_CLINIC_OR_DEPARTMENT_OTHER): Payer: Self-pay

## 2022-07-24 ENCOUNTER — Other Ambulatory Visit (HOSPITAL_BASED_OUTPATIENT_CLINIC_OR_DEPARTMENT_OTHER): Payer: Self-pay

## 2022-07-24 DIAGNOSIS — U071 COVID-19: Secondary | ICD-10-CM | POA: Diagnosis not present

## 2022-07-24 MED ORDER — PAXLOVID (300/100) 20 X 150 MG & 10 X 100MG PO TBPK
ORAL_TABLET | ORAL | 0 refills | Status: AC
  Filled 2022-07-24: qty 30, 5d supply, fill #0

## 2022-08-21 DIAGNOSIS — Z6822 Body mass index (BMI) 22.0-22.9, adult: Secondary | ICD-10-CM | POA: Diagnosis not present

## 2022-08-21 DIAGNOSIS — Z01419 Encounter for gynecological examination (general) (routine) without abnormal findings: Secondary | ICD-10-CM | POA: Diagnosis not present

## 2022-08-21 DIAGNOSIS — N952 Postmenopausal atrophic vaginitis: Secondary | ICD-10-CM | POA: Diagnosis not present

## 2022-08-22 DIAGNOSIS — I87393 Chronic venous hypertension (idiopathic) with other complications of bilateral lower extremity: Secondary | ICD-10-CM | POA: Diagnosis not present

## 2022-08-22 DIAGNOSIS — L299 Pruritus, unspecified: Secondary | ICD-10-CM | POA: Diagnosis not present

## 2022-08-22 DIAGNOSIS — I83892 Varicose veins of left lower extremities with other complications: Secondary | ICD-10-CM | POA: Diagnosis not present

## 2022-08-22 DIAGNOSIS — I8312 Varicose veins of left lower extremity with inflammation: Secondary | ICD-10-CM | POA: Diagnosis not present

## 2022-08-22 DIAGNOSIS — R600 Localized edema: Secondary | ICD-10-CM | POA: Diagnosis not present

## 2022-10-15 DIAGNOSIS — Z0000 Encounter for general adult medical examination without abnormal findings: Secondary | ICD-10-CM | POA: Diagnosis not present

## 2022-10-15 DIAGNOSIS — Z1211 Encounter for screening for malignant neoplasm of colon: Secondary | ICD-10-CM | POA: Diagnosis not present

## 2022-10-15 DIAGNOSIS — Z23 Encounter for immunization: Secondary | ICD-10-CM | POA: Diagnosis not present

## 2022-10-15 DIAGNOSIS — E785 Hyperlipidemia, unspecified: Secondary | ICD-10-CM | POA: Diagnosis not present

## 2022-10-15 DIAGNOSIS — Z79899 Other long term (current) drug therapy: Secondary | ICD-10-CM | POA: Diagnosis not present

## 2022-10-15 DIAGNOSIS — M8589 Other specified disorders of bone density and structure, multiple sites: Secondary | ICD-10-CM | POA: Diagnosis not present

## 2022-11-26 DIAGNOSIS — Z1231 Encounter for screening mammogram for malignant neoplasm of breast: Secondary | ICD-10-CM | POA: Diagnosis not present

## 2022-11-26 DIAGNOSIS — M8588 Other specified disorders of bone density and structure, other site: Secondary | ICD-10-CM | POA: Diagnosis not present

## 2022-12-13 ENCOUNTER — Other Ambulatory Visit (HOSPITAL_BASED_OUTPATIENT_CLINIC_OR_DEPARTMENT_OTHER): Payer: Self-pay

## 2022-12-13 DIAGNOSIS — L237 Allergic contact dermatitis due to plants, except food: Secondary | ICD-10-CM | POA: Diagnosis not present

## 2022-12-13 MED ORDER — PREDNISONE 20 MG PO TABS
40.0000 mg | ORAL_TABLET | Freq: Every day | ORAL | 0 refills | Status: AC
  Filled 2022-12-13: qty 10, 5d supply, fill #0

## 2022-12-13 MED ORDER — TRIAMCINOLONE ACETONIDE 0.1 % EX CREA
TOPICAL_CREAM | Freq: Two times a day (BID) | CUTANEOUS | 0 refills | Status: AC
  Filled 2022-12-13: qty 30, 14d supply, fill #0

## 2022-12-31 ENCOUNTER — Other Ambulatory Visit (HOSPITAL_BASED_OUTPATIENT_CLINIC_OR_DEPARTMENT_OTHER): Payer: Self-pay

## 2023-01-23 DIAGNOSIS — I83892 Varicose veins of left lower extremities with other complications: Secondary | ICD-10-CM | POA: Diagnosis not present

## 2023-01-25 DIAGNOSIS — I83812 Varicose veins of left lower extremities with pain: Secondary | ICD-10-CM | POA: Diagnosis not present

## 2023-01-25 DIAGNOSIS — I872 Venous insufficiency (chronic) (peripheral): Secondary | ICD-10-CM | POA: Diagnosis not present

## 2023-01-31 ENCOUNTER — Other Ambulatory Visit (HOSPITAL_BASED_OUTPATIENT_CLINIC_OR_DEPARTMENT_OTHER): Payer: Self-pay

## 2023-01-31 MED ORDER — COVID-19 MRNA VAC-TRIS(PFIZER) 30 MCG/0.3ML IM SUSY
0.3000 mL | PREFILLED_SYRINGE | Freq: Once | INTRAMUSCULAR | 0 refills | Status: AC
  Filled 2023-01-31: qty 0.3, 1d supply, fill #0

## 2023-02-11 DIAGNOSIS — I83892 Varicose veins of left lower extremities with other complications: Secondary | ICD-10-CM | POA: Diagnosis not present

## 2023-02-11 DIAGNOSIS — I83812 Varicose veins of left lower extremities with pain: Secondary | ICD-10-CM | POA: Diagnosis not present

## 2023-02-11 DIAGNOSIS — M7989 Other specified soft tissue disorders: Secondary | ICD-10-CM | POA: Diagnosis not present

## 2023-02-26 DIAGNOSIS — L814 Other melanin hyperpigmentation: Secondary | ICD-10-CM | POA: Diagnosis not present

## 2023-02-26 DIAGNOSIS — L821 Other seborrheic keratosis: Secondary | ICD-10-CM | POA: Diagnosis not present

## 2023-02-26 DIAGNOSIS — D1801 Hemangioma of skin and subcutaneous tissue: Secondary | ICD-10-CM | POA: Diagnosis not present

## 2023-03-15 ENCOUNTER — Other Ambulatory Visit (HOSPITAL_COMMUNITY): Payer: Self-pay

## 2023-04-01 DIAGNOSIS — H2513 Age-related nuclear cataract, bilateral: Secondary | ICD-10-CM | POA: Diagnosis not present

## 2023-04-01 DIAGNOSIS — H524 Presbyopia: Secondary | ICD-10-CM | POA: Diagnosis not present

## 2023-04-01 DIAGNOSIS — H25013 Cortical age-related cataract, bilateral: Secondary | ICD-10-CM | POA: Diagnosis not present

## 2023-07-31 DIAGNOSIS — I83812 Varicose veins of left lower extremities with pain: Secondary | ICD-10-CM | POA: Diagnosis not present

## 2023-07-31 DIAGNOSIS — M79672 Pain in left foot: Secondary | ICD-10-CM | POA: Diagnosis not present

## 2023-08-26 ENCOUNTER — Other Ambulatory Visit (HOSPITAL_BASED_OUTPATIENT_CLINIC_OR_DEPARTMENT_OTHER): Payer: Self-pay

## 2023-08-26 MED ORDER — AMOXICILLIN 500 MG PO CAPS
2000.0000 mg | ORAL_CAPSULE | ORAL | 3 refills | Status: AC
  Filled 2023-08-26: qty 4, 1d supply, fill #0
  Filled 2023-12-04: qty 4, 1d supply, fill #1
  Filled 2024-03-11: qty 4, 1d supply, fill #2
  Filled 2024-03-28: qty 4, 1d supply, fill #3

## 2023-10-01 DIAGNOSIS — I8312 Varicose veins of left lower extremity with inflammation: Secondary | ICD-10-CM | POA: Diagnosis not present

## 2023-10-01 DIAGNOSIS — L299 Pruritus, unspecified: Secondary | ICD-10-CM | POA: Diagnosis not present

## 2023-10-01 DIAGNOSIS — I83892 Varicose veins of left lower extremities with other complications: Secondary | ICD-10-CM | POA: Diagnosis not present

## 2023-10-01 DIAGNOSIS — R600 Localized edema: Secondary | ICD-10-CM | POA: Diagnosis not present

## 2023-10-23 ENCOUNTER — Other Ambulatory Visit (HOSPITAL_BASED_OUTPATIENT_CLINIC_OR_DEPARTMENT_OTHER): Payer: Self-pay

## 2023-10-23 DIAGNOSIS — Z6822 Body mass index (BMI) 22.0-22.9, adult: Secondary | ICD-10-CM | POA: Diagnosis not present

## 2023-10-23 DIAGNOSIS — W57XXXA Bitten or stung by nonvenomous insect and other nonvenomous arthropods, initial encounter: Secondary | ICD-10-CM | POA: Diagnosis not present

## 2023-10-23 DIAGNOSIS — N952 Postmenopausal atrophic vaginitis: Secondary | ICD-10-CM | POA: Diagnosis not present

## 2023-10-23 DIAGNOSIS — S30860A Insect bite (nonvenomous) of lower back and pelvis, initial encounter: Secondary | ICD-10-CM | POA: Diagnosis not present

## 2023-10-23 DIAGNOSIS — Z01419 Encounter for gynecological examination (general) (routine) without abnormal findings: Secondary | ICD-10-CM | POA: Diagnosis not present

## 2023-10-23 MED ORDER — DOXYCYCLINE HYCLATE 100 MG PO CAPS
100.0000 mg | ORAL_CAPSULE | Freq: Two times a day (BID) | ORAL | 0 refills | Status: AC
  Filled 2023-10-23: qty 2, 1d supply, fill #0

## 2023-11-08 DIAGNOSIS — Z79899 Other long term (current) drug therapy: Secondary | ICD-10-CM | POA: Diagnosis not present

## 2023-11-08 DIAGNOSIS — Z0000 Encounter for general adult medical examination without abnormal findings: Secondary | ICD-10-CM | POA: Diagnosis not present

## 2023-11-08 DIAGNOSIS — E785 Hyperlipidemia, unspecified: Secondary | ICD-10-CM | POA: Diagnosis not present

## 2023-11-13 DIAGNOSIS — Z0000 Encounter for general adult medical examination without abnormal findings: Secondary | ICD-10-CM | POA: Diagnosis not present

## 2023-11-13 DIAGNOSIS — E785 Hyperlipidemia, unspecified: Secondary | ICD-10-CM | POA: Diagnosis not present

## 2023-11-13 DIAGNOSIS — M8589 Other specified disorders of bone density and structure, multiple sites: Secondary | ICD-10-CM | POA: Diagnosis not present

## 2023-11-13 DIAGNOSIS — Z79899 Other long term (current) drug therapy: Secondary | ICD-10-CM | POA: Diagnosis not present

## 2023-11-13 DIAGNOSIS — Z23 Encounter for immunization: Secondary | ICD-10-CM | POA: Diagnosis not present

## 2023-12-09 DIAGNOSIS — Z1231 Encounter for screening mammogram for malignant neoplasm of breast: Secondary | ICD-10-CM | POA: Diagnosis not present

## 2024-01-03 ENCOUNTER — Other Ambulatory Visit (HOSPITAL_BASED_OUTPATIENT_CLINIC_OR_DEPARTMENT_OTHER): Payer: Self-pay

## 2024-01-03 MED ORDER — COMIRNATY 30 MCG/0.3ML IM SUSY
0.3000 mL | PREFILLED_SYRINGE | Freq: Once | INTRAMUSCULAR | 0 refills | Status: AC
  Filled 2024-01-03: qty 0.3, 1d supply, fill #0

## 2024-03-11 ENCOUNTER — Other Ambulatory Visit (HOSPITAL_BASED_OUTPATIENT_CLINIC_OR_DEPARTMENT_OTHER): Payer: Self-pay

## 2024-03-18 ENCOUNTER — Other Ambulatory Visit (HOSPITAL_BASED_OUTPATIENT_CLINIC_OR_DEPARTMENT_OTHER): Payer: Self-pay

## 2024-03-18 DIAGNOSIS — N907 Vulvar cyst: Secondary | ICD-10-CM | POA: Diagnosis not present

## 2024-03-18 MED ORDER — CEPHALEXIN 500 MG PO CAPS
500.0000 mg | ORAL_CAPSULE | Freq: Four times a day (QID) | ORAL | 0 refills | Status: AC | PRN
  Filled 2024-03-18: qty 28, 7d supply, fill #0

## 2024-03-27 ENCOUNTER — Other Ambulatory Visit (HOSPITAL_BASED_OUTPATIENT_CLINIC_OR_DEPARTMENT_OTHER): Payer: Self-pay

## 2024-03-28 ENCOUNTER — Other Ambulatory Visit (HOSPITAL_BASED_OUTPATIENT_CLINIC_OR_DEPARTMENT_OTHER): Payer: Self-pay
